# Patient Record
Sex: Female | Born: 1970 | Race: White | Hispanic: No | Marital: Married | Smoking: Former smoker
Health system: Southern US, Community
[De-identification: ages and names within clinical notes are randomized; demographics above are authoritative.]

## PROBLEM LIST (undated history)

## (undated) DIAGNOSIS — N920 Excessive and frequent menstruation with regular cycle: Secondary | ICD-10-CM

## (undated) DIAGNOSIS — K8020 Calculus of gallbladder without cholecystitis without obstruction: Secondary | ICD-10-CM

## (undated) DIAGNOSIS — D259 Leiomyoma of uterus, unspecified: Secondary | ICD-10-CM

## (undated) DIAGNOSIS — Z973 Presence of spectacles and contact lenses: Secondary | ICD-10-CM

## (undated) HISTORY — PX: WISDOM TOOTH EXTRACTION: SHX21

## (undated) HISTORY — DX: Calculus of gallbladder without cholecystitis without obstruction: K80.20

---

## 2011-07-15 ENCOUNTER — Ambulatory Visit (INDEPENDENT_AMBULATORY_CARE_PROVIDER_SITE_OTHER): Payer: BC Managed Care – PPO

## 2011-07-15 DIAGNOSIS — R05 Cough: Secondary | ICD-10-CM

## 2011-07-15 DIAGNOSIS — R059 Cough, unspecified: Secondary | ICD-10-CM

## 2011-07-15 DIAGNOSIS — IMO0001 Reserved for inherently not codable concepts without codable children: Secondary | ICD-10-CM

## 2014-06-14 ENCOUNTER — Other Ambulatory Visit: Payer: Self-pay | Admitting: Obstetrics and Gynecology

## 2014-06-14 DIAGNOSIS — N63 Unspecified lump in unspecified breast: Secondary | ICD-10-CM

## 2014-06-24 ENCOUNTER — Other Ambulatory Visit: Payer: Self-pay | Admitting: Obstetrics and Gynecology

## 2014-06-24 ENCOUNTER — Ambulatory Visit
Admission: RE | Admit: 2014-06-24 | Discharge: 2014-06-24 | Disposition: A | Discharge status: Home / Self Care | Payer: BC Managed Care – PPO | Source: Ambulatory Visit | Attending: Obstetrics and Gynecology | Admitting: Obstetrics and Gynecology

## 2014-06-24 DIAGNOSIS — N6002 Solitary cyst of left breast: Secondary | ICD-10-CM

## 2014-06-24 DIAGNOSIS — N63 Unspecified lump in unspecified breast: Secondary | ICD-10-CM

## 2014-06-25 ENCOUNTER — Other Ambulatory Visit: Payer: BC Managed Care – PPO

## 2016-02-04 DIAGNOSIS — N909 Noninflammatory disorder of vulva and perineum, unspecified: Secondary | ICD-10-CM | POA: Diagnosis not present

## 2016-04-07 DIAGNOSIS — Z1231 Encounter for screening mammogram for malignant neoplasm of breast: Secondary | ICD-10-CM | POA: Diagnosis not present

## 2016-04-07 DIAGNOSIS — Z01419 Encounter for gynecological examination (general) (routine) without abnormal findings: Secondary | ICD-10-CM | POA: Diagnosis not present

## 2016-04-07 DIAGNOSIS — Z6826 Body mass index (BMI) 26.0-26.9, adult: Secondary | ICD-10-CM | POA: Diagnosis not present

## 2016-04-07 DIAGNOSIS — Z124 Encounter for screening for malignant neoplasm of cervix: Secondary | ICD-10-CM | POA: Diagnosis not present

## 2016-12-15 DIAGNOSIS — Z0000 Encounter for general adult medical examination without abnormal findings: Secondary | ICD-10-CM | POA: Diagnosis not present

## 2016-12-15 DIAGNOSIS — E7800 Pure hypercholesterolemia, unspecified: Secondary | ICD-10-CM | POA: Diagnosis not present

## 2017-04-14 DIAGNOSIS — D3131 Benign neoplasm of right choroid: Secondary | ICD-10-CM | POA: Diagnosis not present

## 2017-04-29 DIAGNOSIS — Z124 Encounter for screening for malignant neoplasm of cervix: Secondary | ICD-10-CM | POA: Diagnosis not present

## 2017-04-29 DIAGNOSIS — Z1231 Encounter for screening mammogram for malignant neoplasm of breast: Secondary | ICD-10-CM | POA: Diagnosis not present

## 2017-04-29 DIAGNOSIS — Z01419 Encounter for gynecological examination (general) (routine) without abnormal findings: Secondary | ICD-10-CM | POA: Diagnosis not present

## 2017-04-29 DIAGNOSIS — Z6828 Body mass index (BMI) 28.0-28.9, adult: Secondary | ICD-10-CM | POA: Diagnosis not present

## 2018-01-20 DIAGNOSIS — E7800 Pure hypercholesterolemia, unspecified: Secondary | ICD-10-CM | POA: Diagnosis not present

## 2018-01-20 DIAGNOSIS — Z0000 Encounter for general adult medical examination without abnormal findings: Secondary | ICD-10-CM | POA: Diagnosis not present

## 2018-05-09 DIAGNOSIS — D3131 Benign neoplasm of right choroid: Secondary | ICD-10-CM | POA: Diagnosis not present

## 2018-06-06 DIAGNOSIS — Z1231 Encounter for screening mammogram for malignant neoplasm of breast: Secondary | ICD-10-CM | POA: Diagnosis not present

## 2018-06-06 DIAGNOSIS — Z6830 Body mass index (BMI) 30.0-30.9, adult: Secondary | ICD-10-CM | POA: Diagnosis not present

## 2018-06-06 DIAGNOSIS — Z124 Encounter for screening for malignant neoplasm of cervix: Secondary | ICD-10-CM | POA: Diagnosis not present

## 2018-06-06 DIAGNOSIS — Z01419 Encounter for gynecological examination (general) (routine) without abnormal findings: Secondary | ICD-10-CM | POA: Diagnosis not present

## 2018-07-19 DIAGNOSIS — Z6830 Body mass index (BMI) 30.0-30.9, adult: Secondary | ICD-10-CM | POA: Diagnosis not present

## 2018-07-19 DIAGNOSIS — Z30433 Encounter for removal and reinsertion of intrauterine contraceptive device: Secondary | ICD-10-CM | POA: Diagnosis not present

## 2019-02-01 DIAGNOSIS — Z20828 Contact with and (suspected) exposure to other viral communicable diseases: Secondary | ICD-10-CM | POA: Diagnosis not present

## 2019-02-01 DIAGNOSIS — R509 Fever, unspecified: Secondary | ICD-10-CM | POA: Diagnosis not present

## 2019-02-20 DIAGNOSIS — E7800 Pure hypercholesterolemia, unspecified: Secondary | ICD-10-CM | POA: Diagnosis not present

## 2019-02-20 DIAGNOSIS — Z0000 Encounter for general adult medical examination without abnormal findings: Secondary | ICD-10-CM | POA: Diagnosis not present

## 2019-04-30 DIAGNOSIS — Z20828 Contact with and (suspected) exposure to other viral communicable diseases: Secondary | ICD-10-CM | POA: Diagnosis not present

## 2019-07-03 DIAGNOSIS — U071 COVID-19: Secondary | ICD-10-CM

## 2019-07-03 HISTORY — DX: COVID-19: U07.1

## 2019-07-11 DIAGNOSIS — Z20828 Contact with and (suspected) exposure to other viral communicable diseases: Secondary | ICD-10-CM | POA: Diagnosis not present

## 2019-07-18 DIAGNOSIS — R05 Cough: Secondary | ICD-10-CM | POA: Diagnosis not present

## 2019-07-18 DIAGNOSIS — G44209 Tension-type headache, unspecified, not intractable: Secondary | ICD-10-CM | POA: Diagnosis not present

## 2019-07-18 DIAGNOSIS — R5383 Other fatigue: Secondary | ICD-10-CM | POA: Diagnosis not present

## 2019-07-19 DIAGNOSIS — R05 Cough: Secondary | ICD-10-CM | POA: Diagnosis not present

## 2019-07-19 DIAGNOSIS — R5383 Other fatigue: Secondary | ICD-10-CM | POA: Diagnosis not present

## 2019-07-19 DIAGNOSIS — G44209 Tension-type headache, unspecified, not intractable: Secondary | ICD-10-CM | POA: Diagnosis not present

## 2019-10-03 DIAGNOSIS — Z8742 Personal history of other diseases of the female genital tract: Secondary | ICD-10-CM | POA: Diagnosis not present

## 2019-10-03 DIAGNOSIS — Z6830 Body mass index (BMI) 30.0-30.9, adult: Secondary | ICD-10-CM | POA: Diagnosis not present

## 2019-10-03 DIAGNOSIS — Z01419 Encounter for gynecological examination (general) (routine) without abnormal findings: Secondary | ICD-10-CM | POA: Diagnosis not present

## 2019-10-12 DIAGNOSIS — Z1231 Encounter for screening mammogram for malignant neoplasm of breast: Secondary | ICD-10-CM | POA: Diagnosis not present

## 2020-01-01 DIAGNOSIS — D3131 Benign neoplasm of right choroid: Secondary | ICD-10-CM | POA: Diagnosis not present

## 2020-04-15 DIAGNOSIS — Z0000 Encounter for general adult medical examination without abnormal findings: Secondary | ICD-10-CM | POA: Diagnosis not present

## 2020-04-15 DIAGNOSIS — E7800 Pure hypercholesterolemia, unspecified: Secondary | ICD-10-CM | POA: Diagnosis not present

## 2020-05-21 DIAGNOSIS — R0681 Apnea, not elsewhere classified: Secondary | ICD-10-CM | POA: Diagnosis not present

## 2020-09-24 DIAGNOSIS — R059 Cough, unspecified: Secondary | ICD-10-CM | POA: Diagnosis not present

## 2020-10-28 DIAGNOSIS — Z01419 Encounter for gynecological examination (general) (routine) without abnormal findings: Secondary | ICD-10-CM | POA: Diagnosis not present

## 2020-10-28 DIAGNOSIS — Z1231 Encounter for screening mammogram for malignant neoplasm of breast: Secondary | ICD-10-CM | POA: Diagnosis not present

## 2021-01-26 DIAGNOSIS — D3131 Benign neoplasm of right choroid: Secondary | ICD-10-CM | POA: Diagnosis not present

## 2021-04-21 DIAGNOSIS — L814 Other melanin hyperpigmentation: Secondary | ICD-10-CM | POA: Diagnosis not present

## 2021-04-21 DIAGNOSIS — L821 Other seborrheic keratosis: Secondary | ICD-10-CM | POA: Diagnosis not present

## 2021-04-21 DIAGNOSIS — D485 Neoplasm of uncertain behavior of skin: Secondary | ICD-10-CM | POA: Diagnosis not present

## 2021-05-25 ENCOUNTER — Emergency Department (HOSPITAL_COMMUNITY): Payer: BC Managed Care – PPO

## 2021-05-25 ENCOUNTER — Other Ambulatory Visit: Payer: Self-pay

## 2021-05-25 ENCOUNTER — Emergency Department (HOSPITAL_COMMUNITY)
Admission: EM | Admit: 2021-05-25 | Discharge: 2021-05-25 | Disposition: A | Discharge status: Home / Self Care | Payer: BC Managed Care – PPO | Attending: Emergency Medicine | Admitting: Emergency Medicine

## 2021-05-25 ENCOUNTER — Encounter (HOSPITAL_COMMUNITY): Payer: Self-pay

## 2021-05-25 DIAGNOSIS — R52 Pain, unspecified: Secondary | ICD-10-CM

## 2021-05-25 DIAGNOSIS — K8020 Calculus of gallbladder without cholecystitis without obstruction: Secondary | ICD-10-CM | POA: Diagnosis not present

## 2021-05-25 DIAGNOSIS — M5137 Other intervertebral disc degeneration, lumbosacral region: Secondary | ICD-10-CM | POA: Diagnosis not present

## 2021-05-25 DIAGNOSIS — R9431 Abnormal electrocardiogram [ECG] [EKG]: Secondary | ICD-10-CM | POA: Diagnosis not present

## 2021-05-25 DIAGNOSIS — I700 Atherosclerosis of aorta: Secondary | ICD-10-CM | POA: Diagnosis not present

## 2021-05-25 DIAGNOSIS — R1011 Right upper quadrant pain: Secondary | ICD-10-CM | POA: Diagnosis not present

## 2021-05-25 LAB — CBC WITH DIFFERENTIAL/PLATELET
Abs Immature Granulocytes: 0.04 10*3/uL (ref 0.00–0.07)
Basophils Absolute: 0 10*3/uL (ref 0.0–0.1)
Basophils Relative: 0 %
Eosinophils Absolute: 0 10*3/uL (ref 0.0–0.5)
Eosinophils Relative: 0 %
HCT: 44.6 % (ref 36.0–46.0)
Hemoglobin: 15.6 g/dL — ABNORMAL HIGH (ref 12.0–15.0)
Immature Granulocytes: 0 %
Lymphocytes Relative: 6 %
Lymphs Abs: 0.6 10*3/uL — ABNORMAL LOW (ref 0.7–4.0)
MCH: 31.8 pg (ref 26.0–34.0)
MCHC: 35 g/dL (ref 30.0–36.0)
MCV: 91 fL (ref 80.0–100.0)
Monocytes Absolute: 0.3 10*3/uL (ref 0.1–1.0)
Monocytes Relative: 3 %
Neutro Abs: 9.7 10*3/uL — ABNORMAL HIGH (ref 1.7–7.7)
Neutrophils Relative %: 91 %
Platelets: 286 10*3/uL (ref 150–400)
RBC: 4.9 MIL/uL (ref 3.87–5.11)
RDW: 12.2 % (ref 11.5–15.5)
WBC: 10.7 10*3/uL — ABNORMAL HIGH (ref 4.0–10.5)
nRBC: 0 % (ref 0.0–0.2)

## 2021-05-25 LAB — COMPREHENSIVE METABOLIC PANEL
ALT: 30 U/L (ref 0–44)
AST: 24 U/L (ref 15–41)
Albumin: 4.9 g/dL (ref 3.5–5.0)
Alkaline Phosphatase: 71 U/L (ref 38–126)
Anion gap: 8 (ref 5–15)
BUN: 9 mg/dL (ref 6–20)
CO2: 25 mmol/L (ref 22–32)
Calcium: 9.3 mg/dL (ref 8.9–10.3)
Chloride: 102 mmol/L (ref 98–111)
Creatinine, Ser: 0.76 mg/dL (ref 0.44–1.00)
GFR, Estimated: >60 mL/min (ref >60)
Glucose, Bld: 182 mg/dL — ABNORMAL HIGH (ref 70–99)
Potassium: 4.2 mmol/L (ref 3.5–5.1)
Sodium: 135 mmol/L (ref 135–145)
Total Bilirubin: 0.7 mg/dL (ref 0.3–1.2)
Total Protein: 7.8 g/dL (ref 6.5–8.1)

## 2021-05-25 LAB — HCG, QUANTITATIVE, PREGNANCY: hCG, Beta Chain, Quant, S: 1 m[IU]/mL (ref <5)

## 2021-05-25 LAB — LIPASE, BLOOD: Lipase: 28 U/L (ref 11–51)

## 2021-05-25 MED ORDER — OXYCODONE HCL 5 MG PO TABS: 5.0000 mg | ORAL_TABLET | ORAL | 0 refills | Status: DC | PRN

## 2021-05-25 MED ORDER — SODIUM CHLORIDE 0.9 % IV BOLUS
500.0000 mL | Freq: Once | INTRAVENOUS | Status: AC
  Administered 2021-05-25: 500 mL via INTRAVENOUS

## 2021-05-25 MED ORDER — IOHEXOL 350 MG/ML SOLN
80.0000 mL | Freq: Once | INTRAVENOUS | Status: AC | PRN
  Administered 2021-05-25: 80 mL via INTRAVENOUS

## 2021-05-25 MED ORDER — SODIUM CHLORIDE 0.9 % IV BOLUS
1000.0000 mL | Freq: Once | INTRAVENOUS | Status: AC
  Administered 2021-05-25: 1000 mL via INTRAVENOUS

## 2021-05-25 MED ORDER — ONDANSETRON HCL 4 MG/2ML IJ SOLN
4.0000 mg | Freq: Once | INTRAMUSCULAR | Status: AC
  Administered 2021-05-25: 4 mg via INTRAVENOUS
  Filled 2021-05-25: qty 2

## 2021-05-25 MED ORDER — FENTANYL CITRATE PF 50 MCG/ML IJ SOSY
50.0000 ug | PREFILLED_SYRINGE | Freq: Once | INTRAMUSCULAR | Status: AC
Start: 2021-05-25 — End: 2021-05-25
  Administered 2021-05-25: 50 ug via INTRAVENOUS
  Filled 2021-05-25: qty 1

## 2021-05-25 MED ORDER — DIPHENHYDRAMINE HCL 50 MG/ML IJ SOLN
25.0000 mg | Freq: Once | INTRAMUSCULAR | Status: AC
  Administered 2021-05-25: 25 mg via INTRAVENOUS
  Filled 2021-05-25: qty 1

## 2021-05-25 MED ORDER — PROCHLORPERAZINE EDISYLATE 10 MG/2ML IJ SOLN
10.0000 mg | Freq: Once | INTRAMUSCULAR | Status: AC
  Administered 2021-05-25: 10 mg via INTRAVENOUS
  Filled 2021-05-25: qty 2

## 2021-05-25 MED ORDER — ONDANSETRON HCL 4 MG PO TABS: 4.0000 mg | ORAL_TABLET | Freq: Four times a day (QID) | ORAL | 0 refills | Status: AC

## 2021-05-25 NOTE — Discharge Instructions (Signed)
Suspect that your pain is secondary from gallstones.  There is no evidence of gallbladder infection at this time.  Make dietary modifications as we discussed.  Please follow-up with general surgeon as your gallbladder may need to be removed.  If you develop any fever, worsening pain, nausea or vomiting please return for reevaluation.

## 2021-05-25 NOTE — ED Provider Notes (Signed)
[facility] DEPT Provider Note   CSN: [id number] Arrival date & time: 05/25/21  0539     History Chief Complaint  Patient presents with   Vomiting    Amber Davies is a 50 y.o. female.  The history is provided by the patient.  Emesis Severity:  Moderate Timing:  Constant Progression:  Worsening Chronicity:  New Relieved by:  Nothing Worsened by:  Nothing Associated symptoms: abdominal pain   Associated symptoms: no arthralgias, no chills, no cough, no diarrhea, no fever, no headaches, no myalgias, no sore throat and no URI   Risk factors: no prior abdominal surgery, no sick contacts and no suspect food intake       History reviewed. No pertinent past medical history.  There are no problems to display for this patient.   History reviewed. No pertinent surgical history.   OB History   No obstetric history on file.     No family history on file.  Social History   Tobacco Use   Smoking status: Never   Smokeless tobacco: Never  Substance Use Topics   Alcohol use: Never   Drug use: Never    Home Medications Prior to Admission medications   Medication Sig Start Date End Date Taking? Authorizing Provider  ondansetron (ZOFRAN) 4 MG tablet Take 1 tablet (4 mg total) by mouth every 6 (six) hours for 20 doses. 05/25/21 05/30/21 Yes Balin Vandegrift, DO  oxyCODONE (ROXICODONE) 5 MG immediate release tablet Take 1 tablet (5 mg total) by mouth every 4 (four) hours as needed for up to 15 doses for breakthrough pain. 05/25/21  Yes Natallia Stellmach, DO    Allergies    Patient has no known allergies.  Review of Systems   Review of Systems  Constitutional:  Negative for chills and fever.  HENT:  Negative for ear pain and sore throat.   Eyes:  Negative for pain and visual disturbance.  Respiratory:  Negative for cough and shortness of breath.   Cardiovascular:  Negative for chest pain and palpitations.  Gastrointestinal:  Positive for  abdominal pain and vomiting. Negative for diarrhea.  Genitourinary:  Negative for dysuria and hematuria.  Musculoskeletal:  Negative for arthralgias, back pain and myalgias.  Skin:  Negative for color change and rash.  Neurological:  Negative for seizures, syncope and headaches.  All other systems reviewed and are negative.  Physical Exam Updated Vital Signs BP (!) 172/87   Pulse 84   Temp 97.6 F (36.4 C) (Oral)   Resp 16   Ht 5\' 6"  (1.676 m)   Wt 81.6 kg   SpO2 99%   BMI 29.05 kg/m   Physical Exam Vitals and nursing note reviewed.  Constitutional:      General: She is not in acute distress.    Appearance: She is well-developed. She is ill-appearing.  HENT:     Head: Normocephalic and atraumatic.     Nose: Nose normal.     Mouth/Throat:     Mouth: Mucous membranes are dry.  Eyes:     Extraocular Movements: Extraocular movements intact.     Conjunctiva/sclera: Conjunctivae normal.     Pupils: Pupils are equal, round, and reactive to light.  Cardiovascular:     Rate and Rhythm: Normal rate and regular rhythm.     Pulses: Normal pulses.     Heart sounds: Normal heart sounds. No murmur heard. Pulmonary:     Effort: Pulmonary effort is normal. No respiratory distress.     Breath sounds:  Normal breath sounds.  Abdominal:     Palpations: Abdomen is soft.     Tenderness: There is abdominal tenderness (upper admoen). There is no right CVA tenderness or left CVA tenderness.  Musculoskeletal:     Cervical back: Normal range of motion and neck supple.  Skin:    General: Skin is warm and dry.     Capillary Refill: Capillary refill takes less than 2 seconds.  Neurological:     General: No focal deficit present.     Mental Status: She is alert.    ED Results / Procedures / Treatments   Labs (all labs ordered are listed, but only abnormal results are displayed) Labs Reviewed  CBC WITH DIFFERENTIAL/PLATELET - Abnormal; Notable for the following components:      Result Value    WBC 10.7 (*)    Hemoglobin 15.6 (*)    Neutro Abs 9.7 (*)    Lymphs Abs 0.6 (*)    All other components within normal limits  COMPREHENSIVE METABOLIC PANEL - Abnormal; Notable for the following components:   Glucose, Bld 182 (*)    All other components within normal limits  LIPASE, BLOOD  HCG, QUANTITATIVE, PREGNANCY  URINALYSIS, ROUTINE W REFLEX MICROSCOPIC    EKG EKG Interpretation  Date/Time:  Monday May 25 2021 07:41:07 EDT Ventricular Rate:  75 PR Interval:  125 QRS Duration: 97 QT Interval:  399 QTC Calculation: 446 R Axis:   67 Text Interpretation: Sinus rhythm Confirmed by Lennice Sites ([id number]) on 05/25/2021 8:02:20 AM  Radiology CT ABDOMEN PELVIS W CONTRAST  Result Date: 05/25/2021 CLINICAL DATA:  Evaluate pancreatitis. Persistent right upper quadrant and epigastric pain. EXAM: CT ABDOMEN AND PELVIS WITH CONTRAST TECHNIQUE: Multidetector CT imaging of the abdomen and pelvis was performed using the standard protocol following bolus administration of intravenous contrast. CONTRAST:  25mL OMNIPAQUE IOHEXOL 350 MG/ML SOLN COMPARISON:  None. FINDINGS: Lower chest: No acute abnormality. Hepatobiliary: No focal liver abnormality. Two stones are identified within the gallbladder measuring up to 1.7 cm, image 31/2. No gallbladder wall thickening or pericholecystic inflammation. No signs of bile duct dilatation. Pancreas: Unremarkable. No pancreatic ductal dilatation or surrounding inflammatory changes. Spleen: Normal in size without focal abnormality. Adrenals/Urinary Tract: Normal adrenal glands. No kidney mass or hydronephrosis identified bilaterally. Urinary bladder is unremarkable. Stomach/Bowel: Stomach is within normal limits. Appendix appears normal. No evidence of bowel wall thickening, distention, or inflammatory changes. Scattered colonic diverticula without signs of acute diverticulitis. Vascular/Lymphatic: Aortic atherosclerosis. No enlarged abdominal or pelvic lymph  nodes. Reproductive: There is an IUD within the endometrial cavity. Uterus appears normal. No adnexal mass. Other: No free fluid or fluid collections. Musculoskeletal: No acute or significant osseous findings. Degenerative disc disease identified at L5-S1. IMPRESSION: 1. No acute findings within the abdomen or pelvis. No findings to suggest acute pancreatitis. 2. Gallstones. If there is a clinical concern for acute cholecystitis consider further evaluation with gallbladder sonogram or nuclear medicine hepatic biliary scan. 3. Aortic Atherosclerosis (ICD10-I70.0). Electronically Signed   By: Kerby Moors M.D.   On: 05/25/2021 10:11   US Abdomen Limited RUQ (LIVER/GB)  Result Date: 05/25/2021 CLINICAL DATA:  Right upper quadrant pain EXAM: ULTRASOUND ABDOMEN LIMITED RIGHT UPPER QUADRANT COMPARISON:  CT abdomen pelvis 05/25/2021 FINDINGS: Gallbladder: Cholelithiasis. No gallbladder wall thickening or pericholecystic fluid. Sonographic Percell Miller sign is negative per technologist. Common bile duct: Diameter: 4 mm Liver: No focal lesion identified. Within normal limits in parenchymal echogenicity. Portal vein is patent on color Doppler imaging with normal direction  of blood flow towards the liver. Other: None. IMPRESSION: Moderate cholelithiasis without additional sonographic evidence of acute cholecystitis. Electronically Signed   By: Miachel Roux M.D.   On: 05/25/2021 11:50    Procedures Procedures   Medications Ordered in ED Medications  sodium chloride 0.9 % bolus 1,000 mL (1,000 mLs Intravenous Bolus 05/25/21 0729)  ondansetron (ZOFRAN) injection 4 mg (4 mg Intravenous Given 05/25/21 0729)  fentaNYL (SUBLIMAZE) injection 50 mcg (50 mcg Intravenous Given 05/25/21 0742)  prochlorperazine (COMPAZINE) injection 10 mg (10 mg Intravenous Given 05/25/21 0914)  diphenhydrAMINE (BENADRYL) injection 25 mg (25 mg Intravenous Given 05/25/21 0913)  sodium chloride 0.9 % bolus 500 mL (500 mLs Intravenous Bolus  05/25/21 0914)  iohexol (OMNIPAQUE) 350 MG/ML injection 80 mL (80 mLs Intravenous Contrast Given 05/25/21 0933)    ED Course  I have reviewed the triage vital signs and the nursing notes.  Pertinent labs & imaging results that were available during my care of the patient were reviewed by me and considered in my medical decision making (see chart for details).    MDM Rules/Calculators/A&P                           Gyselle Matthew is here with abdominal pain, nausea, vomiting.  Overall unremarkable vitals.  No fever.  Woke up feeling nauseous with upper abdominal pain.  Denies any suspicious food intake.  No fevers.  Patient with tenderness to her upper abdomen.  Still feels nauseous on exam of her.  No chest pain or shortness of breath.  Suspect viral/foodborne process versus may be pancreatitis or cholecystitis.  Less likely to be bowel obstruction.  We will get basic labs and CT scan abdomen and pelvis to further evaluate.  Will give IV fluids, IV antiemetics and pain medicine and reevaluate.  No significant anemia, electrolyte abnormality, kidney injury, leukocytosis.  Gallbladder and liver enzymes within normal limits.  Lipase within normal limits.  However CT scan showed evidence of gallstones.  But no obvious cholecystitis.  Ultrasound was obtained that showed moderate cholelithiasis but no evidence of acute cholecystitis.  Her pain is much improved.  Pain started after eating pizza last night.  Suspect her pain is from gallstones.  Educated her about dietary modifications.  We will write her for nausea medicine and pain medicine to use as needed.  She understands return precautions but at this time I think she is safe for follow-up with general surgery.  Discharged in good condition.  This chart was dictated using voice recognition software.  Despite best efforts to proofread,  errors can occur which can change the documentation meaning.   Final Clinical Impression(s) / ED Diagnoses Final  diagnoses:  Pain  RUQ abdominal pain  Calculus of gallbladder without cholecystitis without obstruction    Rx / DC Orders ED Discharge Orders          Ordered    oxyCODONE (ROXICODONE) 5 MG immediate release tablet  Every 4 hours PRN        05/25/21 1230    ondansetron (ZOFRAN) 4 MG tablet  Every 6 hours        05/25/21 [place], [place], DO 05/25/21 1232

## 2021-05-25 NOTE — ED Triage Notes (Signed)
Pt reports feeling ill last night around 2100. Went to bed and began vomiting around 2130. Approx 7-8 episodes. Afterwards had an epigastric burning sensation. Denies diarrhea.

## 2021-05-25 NOTE — ED Notes (Signed)
Pt states that she is unable to use the bathroom at this time, will inform me when she needs too.

## 2021-06-10 DIAGNOSIS — K8020 Calculus of gallbladder without cholecystitis without obstruction: Secondary | ICD-10-CM | POA: Diagnosis not present

## 2021-06-19 ENCOUNTER — Encounter (HOSPITAL_COMMUNITY): Payer: Self-pay | Admitting: General Surgery

## 2021-06-19 ENCOUNTER — Other Ambulatory Visit: Payer: Self-pay

## 2021-06-19 NOTE — Progress Notes (Signed)
Spoke with pt for pre-op call. Pt denies cardiac history, HTN or Diabetes.  Pt's surgery is scheduled as ambulatory so no Covid test is required prior to surgery.

## 2021-06-22 ENCOUNTER — Ambulatory Visit: Payer: Self-pay | Admitting: Neurosurgery

## 2021-06-22 ENCOUNTER — Encounter (HOSPITAL_COMMUNITY): Payer: Self-pay | Admitting: General Surgery

## 2021-06-22 NOTE — Anesthesia Preprocedure Evaluation (Addendum)
Anesthesia Evaluation  Patient identified by MRN, date of birth, ID band Patient awake    Reviewed: Allergy & Precautions, NPO status , Patient's Chart, lab work & pertinent test results  Airway Mallampati: II  TM Distance: >3 FB Neck ROM: Full    Dental no notable dental hx. (+) Teeth Intact, Dental Advisory Given   Pulmonary neg pulmonary ROS, former smoker,    Pulmonary exam normal breath sounds clear to auscultation       Cardiovascular negative cardio ROS Normal cardiovascular exam Rhythm:Regular Rate:Normal     Neuro/Psych negative neurological ROS  negative psych ROS   GI/Hepatic Neg liver ROS, Symptomatic cholelithiasis   Endo/Other  hypercholesterolemia  Renal/GU negative Renal ROS  negative genitourinary   Musculoskeletal negative musculoskeletal ROS (+)   Abdominal   Peds  Hematology negative hematology ROS (+)   Anesthesia Other Findings   Reproductive/Obstetrics negative OB ROS                            Anesthesia Physical Anesthesia Plan  ASA: 2  Anesthesia Plan: General   Post-op Pain Management: Dilaudid IV, Gabapentin PO (pre-op), Celebrex PO (pre-op) and Tylenol PO (pre-op)   Induction: Intravenous  PONV Risk Score and Plan: 4 or greater and Treatment may vary due to age or medical condition, Midazolam, Scopolamine patch - Pre-op, Ondansetron, Dexamethasone and Diphenhydramine  Airway Management Planned: Oral ETT  Additional Equipment:   Intra-op Plan:   Post-operative Plan: Extubation in OR  Informed Consent: I have reviewed the patients History and Physical, chart, labs and discussed the procedure including the risks, benefits and alternatives for the proposed anesthesia with the patient or authorized representative who has indicated his/her understanding and acceptance.     Dental advisory given  Plan Discussed with: CRNA and  Anesthesiologist  Anesthesia Plan Comments:       Anesthesia Quick Evaluation

## 2021-06-22 NOTE — H&P (View-Only) (Signed)
DOB: [birth year] DATE OF ENCOUNTER: 06/10/2021  Subjective   Chief Complaint: Gallstones   History of Present Illness: Amber Davies is a 50 y.o. female who is seen today as an office consultation at the request of Dr. Ronnald Nian for evaluation of abdominal pain.   She had presented to the emergency room in October 24 complaining of moderate and constant abdominal pain in the right upper abdomen underneath her rib cage. She tried Tums without relief.. She had nausea and vomiting. She was mainly having discomfort in her upper abdomen. She underwent labs and imaging. CT scan showed cholelithiasis. This was followed by an abdominal ultrasound that showed moderate cholelithiasis without any evidence of acute cholecystitis. Common bile duct was 4 mm. CBC revealed a white count of 10.7 and a hemoglobin of 15.6. Comprehensive metabolic panel was within normal limits except for an elevated blood sugar level of 182. She was not fasting.  Since then she has not had an attack. She states in retrospect she probably has had several milder episodes over the years and just attributed to indigestion. She definitely remembers 1 instance after her rehearsal dinner before her wedding that she had a fair amount of upper abdominal pain and thought she had just overindulged in it with the stress of the wedding  Review of Systems: A complete review of systems was obtained from the patient. I have reviewed this information and discussed as appropriate with the patient. See HPI as well for other ROS.  ROS   Medical History: Past Medical History:  Diagnosis Date   Allergies  Seasonal   Patient Active Problem List  Diagnosis   Breast lump   Difficulty sleeping   Hypercholesterolemia   Tension-type headache   Past Surgical History:  Procedure Laterality Date   Wisdom Teeth 1993    No Known Allergies  Current Outpatient Medications on File Prior to Visit  Medication Sig Dispense Refill   loratadine  (CLARITIN ORAL) Claritin   No current facility-administered medications on file prior to visit.   History reviewed. No pertinent family history.   Social History   Tobacco Use  Smoking Status Former   Types: Cigarettes   Quit date: 2005   Years since quitting: 17.8  Smokeless Tobacco Never    Social History   Socioeconomic History   Marital status: Married  Tobacco Use   Smoking status: Former  Types: Cigarettes  Quit date: 2005  Years since quitting: 17.8   Smokeless tobacco: Never  Vaping Use   Vaping Use: Never used  Substance and Sexual Activity   Alcohol use: Yes   Drug use: Never   Objective:   Vitals:  06/10/21 0909  BP: 128/82  Pulse: 77  Temp: 36.8 C (98.2 F)  SpO2: 98%  Weight: 84.1 kg (185 lb 6.4 oz)  Height: 167.6 cm (5\' 6" )   Body mass index is 29.92 kg/m.  Constitutional: NAD; conversant; no deformities Eyes: Moist conjunctiva; no lid lag; anicteric; PERRL Neck: Trachea midline; no thyromegaly Lungs: Normal respiratory effort; no tactile fremitus CV: RRR; no palpable thrills; no pitting edema GI: Abd soft, nontender nondistended; no palpable hepatosplenomegaly MSK: Normal gait; no clubbing/cyanosis Psychiatric: Appropriate affect; alert and oriented x3 Lymphatic: No palpable cervical or axillary lymphadenopathy Skin: No rash or lesions  Labs, Imaging and Diagnostic Testing: Documented above  Assessment and Plan:  Diagnoses and all orders for this visit:  Symptomatic cholelithiasis    I believe the patient's symptoms are consistent with gallbladder disease.  We discussed gallbladder disease.  The patient was given Neurosurgeon. We discussed non-operative and operative management. We discussed the signs & symptoms of acute cholecystitis  I discussed laparoscopic cholecystectomy with possible IOC in detail. The patient was given educational material as well as diagrams detailing the procedure. We discussed the risks and  benefits of a laparoscopic cholecystectomy including, but not limited to bleeding, infection, injury to surrounding structures such as the intestine or liver, bile leak, retained gallstones, need to convert to an open procedure, prolonged diarrhea, blood clots such as DVT, common bile duct injury, anesthesia risks, and possible need for additional procedures. We discussed the typical post-operative recovery course. I explained that the likelihood of improvement of their symptoms is good.  This patient encounter took 35 minutes today to perform the following: take history, perform exam, review outside records, interpret imaging, counsel the patient on their diagnosis and document encounter, findings & plan in the EHR  No follow-ups on file.  Amariyana Heacox Leanne Chang, MD  General, Minimally Invasive, & Bariatric Surgery

## 2021-06-22 NOTE — H&P (Signed)
DOB: 1971/07/29 DATE OF ENCOUNTER: 06/10/2021  Subjective   Chief Complaint: Gallstones   History of Present Illness: Amber Davies is a 50 y.o. female who is seen today as an office consultation at the request of Dr. Ronnald Nian for evaluation of abdominal pain.   She had presented to the emergency room in October 24 complaining of moderate and constant abdominal pain in the right upper abdomen underneath her rib cage. She tried Tums without relief.. She had nausea and vomiting. She was mainly having discomfort in her upper abdomen. She underwent labs and imaging. CT scan showed cholelithiasis. This was followed by an abdominal ultrasound that showed moderate cholelithiasis without any evidence of acute cholecystitis. Common bile duct was 4 mm. CBC revealed a white count of 10.7 and a hemoglobin of 15.6. Comprehensive metabolic panel was within normal limits except for an elevated blood sugar level of 182. She was not fasting.  Since then she has not had an attack. She states in retrospect she probably has had several milder episodes over the years and just attributed to indigestion. She definitely remembers 1 instance after her rehearsal dinner before her wedding that she had a fair amount of upper abdominal pain and thought she had just overindulged in it with the stress of the wedding  Review of Systems: A complete review of systems was obtained from the patient. I have reviewed this information and discussed as appropriate with the patient. See HPI as well for other ROS.  ROS   Medical History: Past Medical History:  Diagnosis Date   Allergies  Seasonal   Patient Active Problem List  Diagnosis   Breast lump   Difficulty sleeping   Hypercholesterolemia   Tension-type headache   Past Surgical History:  Procedure Laterality Date   Wisdom Teeth 1993    No Known Allergies  Current Outpatient Medications on File Prior to Visit  Medication Sig Dispense Refill   loratadine  (CLARITIN ORAL) Claritin   No current facility-administered medications on file prior to visit.   History reviewed. No pertinent family history.   Social History   Tobacco Use  Smoking Status Former   Types: Cigarettes   Quit date: 2005   Years since quitting: 17.8  Smokeless Tobacco Never    Social History   Socioeconomic History   Marital status: Married  Tobacco Use   Smoking status: Former  Types: Cigarettes  Quit date: 2005  Years since quitting: 17.8   Smokeless tobacco: Never  Vaping Use   Vaping Use: Never used  Substance and Sexual Activity   Alcohol use: Yes   Drug use: Never   Objective:   Vitals:  06/10/21 0909  BP: 128/82  Pulse: 77  Temp: 36.8 C (98.2 F)  SpO2: 98%  Weight: 84.1 kg (185 lb 6.4 oz)  Height: 167.6 cm (5\' 6" )   Body mass index is 29.92 kg/m.  Constitutional: NAD; conversant; no deformities Eyes: Moist conjunctiva; no lid lag; anicteric; PERRL Neck: Trachea midline; no thyromegaly Lungs: Normal respiratory effort; no tactile fremitus CV: RRR; no palpable thrills; no pitting edema GI: Abd soft, nontender nondistended; no palpable hepatosplenomegaly MSK: Normal gait; no clubbing/cyanosis Psychiatric: Appropriate affect; alert and oriented x3 Lymphatic: No palpable cervical or axillary lymphadenopathy Skin: No rash or lesions  Labs, Imaging and Diagnostic Testing: Documented above  Assessment and Plan:  Diagnoses and all orders for this visit:  Symptomatic cholelithiasis    I believe the patient's symptoms are consistent with gallbladder disease.  We discussed gallbladder disease.  The patient was given Neurosurgeon. We discussed non-operative and operative management. We discussed the signs & symptoms of acute cholecystitis  I discussed laparoscopic cholecystectomy with possible IOC in detail. The patient was given educational material as well as diagrams detailing the procedure. We discussed the risks and  benefits of a laparoscopic cholecystectomy including, but not limited to bleeding, infection, injury to surrounding structures such as the intestine or liver, bile leak, retained gallstones, need to convert to an open procedure, prolonged diarrhea, blood clots such as DVT, common bile duct injury, anesthesia risks, and possible need for additional procedures. We discussed the typical post-operative recovery course. I explained that the likelihood of improvement of their symptoms is good.  This patient encounter took 35 minutes today to perform the following: take history, perform exam, review outside records, interpret imaging, counsel the patient on their diagnosis and document encounter, findings & plan in the EHR  No follow-ups on file.  Ernesha Ramone Leanne Chang, MD  General, Minimally Invasive, & Bariatric Surgery

## 2021-06-23 ENCOUNTER — Ambulatory Visit (HOSPITAL_COMMUNITY)
Admission: RE | Admit: 2021-06-23 | Discharge: 2021-06-23 | Disposition: A | Discharge status: Home / Self Care | Payer: BC Managed Care – PPO | Source: Ambulatory Visit | Attending: General Surgery | Admitting: General Surgery

## 2021-06-23 ENCOUNTER — Encounter (HOSPITAL_COMMUNITY): Payer: Self-pay | Admitting: General Surgery

## 2021-06-23 ENCOUNTER — Ambulatory Visit (HOSPITAL_COMMUNITY): Payer: BC Managed Care – PPO

## 2021-06-23 ENCOUNTER — Ambulatory Visit (HOSPITAL_COMMUNITY): Payer: BC Managed Care – PPO | Admitting: Anesthesiology

## 2021-06-23 ENCOUNTER — Other Ambulatory Visit: Payer: Self-pay

## 2021-06-23 ENCOUNTER — Encounter (HOSPITAL_COMMUNITY)
Admission: RE | Disposition: A | Discharge status: Home / Self Care | Payer: Self-pay | Source: Ambulatory Visit | Attending: General Surgery

## 2021-06-23 DIAGNOSIS — Z87891 Personal history of nicotine dependence: Secondary | ICD-10-CM | POA: Diagnosis not present

## 2021-06-23 DIAGNOSIS — K8000 Calculus of gallbladder with acute cholecystitis without obstruction: Secondary | ICD-10-CM | POA: Diagnosis not present

## 2021-06-23 DIAGNOSIS — Z419 Encounter for procedure for purposes other than remedying health state, unspecified: Secondary | ICD-10-CM

## 2021-06-23 DIAGNOSIS — Z9049 Acquired absence of other specified parts of digestive tract: Secondary | ICD-10-CM | POA: Diagnosis not present

## 2021-06-23 DIAGNOSIS — E7800 Pure hypercholesterolemia, unspecified: Secondary | ICD-10-CM | POA: Diagnosis not present

## 2021-06-23 DIAGNOSIS — K8020 Calculus of gallbladder without cholecystitis without obstruction: Secondary | ICD-10-CM | POA: Diagnosis not present

## 2021-06-23 DIAGNOSIS — K8010 Calculus of gallbladder with chronic cholecystitis without obstruction: Secondary | ICD-10-CM | POA: Diagnosis not present

## 2021-06-23 HISTORY — PX: CHOLECYSTECTOMY: SHX55

## 2021-06-23 HISTORY — PX: INTRAOPERATIVE CHOLANGIOGRAM: SHX5230

## 2021-06-23 LAB — POCT PREGNANCY, URINE: Preg Test, Ur: NEGATIVE

## 2021-06-23 SURGERY — LAPAROSCOPIC CHOLECYSTECTOMY
Anesthesia: General | Site: Abdomen

## 2021-06-23 MED ORDER — DROPERIDOL 2.5 MG/ML IJ SOLN: 0.6250 mg | Freq: Once | INTRAMUSCULAR | Status: DC | PRN

## 2021-06-23 MED ORDER — OXYCODONE HCL 5 MG/5ML PO SOLN: 5.0000 mg | Freq: Once | ORAL | Status: DC | PRN

## 2021-06-23 MED ORDER — ROCURONIUM BROMIDE 10 MG/ML (PF) SYRINGE
PREFILLED_SYRINGE | INTRAVENOUS | Status: DC | PRN
  Administered 2021-06-23: 20 mg via INTRAVENOUS
  Administered 2021-06-23: 60 mg via INTRAVENOUS

## 2021-06-23 MED ORDER — ACETAMINOPHEN 500 MG PO TABS: 1000.0000 mg | ORAL_TABLET | ORAL | Status: AC

## 2021-06-23 MED ORDER — ACETAMINOPHEN 500 MG PO TABS: 1000.0000 mg | ORAL_TABLET | Freq: Three times a day (TID) | ORAL | 0 refills | Status: AC

## 2021-06-23 MED ORDER — MIDAZOLAM HCL 2 MG/2ML IJ SOLN
INTRAMUSCULAR | Status: AC
  Filled 2021-06-23: qty 2

## 2021-06-23 MED ORDER — SODIUM CHLORIDE 0.9 % IV SOLN
2.0000 g | INTRAVENOUS | Status: AC
  Administered 2021-06-23: 2 g via INTRAVENOUS

## 2021-06-23 MED ORDER — HYDROMORPHONE HCL 1 MG/ML IJ SOLN: 0.2500 mg | INTRAMUSCULAR | Status: DC | PRN

## 2021-06-23 MED ORDER — DIPHENHYDRAMINE HCL 50 MG/ML IJ SOLN
25.0000 mg | Freq: Once | INTRAMUSCULAR | Status: AC
  Administered 2021-06-23: 25 mg via INTRAVENOUS
  Filled 2021-06-23: qty 1

## 2021-06-23 MED ORDER — CHLORHEXIDINE GLUCONATE CLOTH 2 % EX PADS: 6.0000 | MEDICATED_PAD | Freq: Once | CUTANEOUS | Status: DC

## 2021-06-23 MED ORDER — SUGAMMADEX SODIUM 200 MG/2ML IV SOLN
INTRAVENOUS | Status: DC | PRN
  Administered 2021-06-23: 200 mg via INTRAVENOUS

## 2021-06-23 MED ORDER — OXYCODONE HCL 5 MG PO TABS: 5.0000 mg | ORAL_TABLET | Freq: Once | ORAL | Status: DC | PRN

## 2021-06-23 MED ORDER — HYDROMORPHONE HCL 1 MG/ML IJ SOLN
INTRAMUSCULAR | Status: AC
  Filled 2021-06-23: qty 0.5

## 2021-06-23 MED ORDER — BUPIVACAINE-EPINEPHRINE 0.25% -1:200000 IJ SOLN
INTRAMUSCULAR | Status: DC | PRN
  Administered 2021-06-23: 20 mL

## 2021-06-23 MED ORDER — SODIUM CHLORIDE (PF) 0.9 % IJ SOLN
INTRAMUSCULAR | Status: DC | PRN
  Administered 2021-06-23: 10 mL

## 2021-06-23 MED ORDER — HYDROMORPHONE HCL 1 MG/ML IJ SOLN
INTRAMUSCULAR | Status: AC
  Filled 2021-06-23: qty 1

## 2021-06-23 MED ORDER — LACTATED RINGERS IV SOLN: INTRAVENOUS | Status: DC

## 2021-06-23 MED ORDER — MIDAZOLAM HCL 2 MG/2ML IJ SOLN
INTRAMUSCULAR | Status: DC | PRN
  Administered 2021-06-23: 2 mg via INTRAVENOUS

## 2021-06-23 MED ORDER — HYDROMORPHONE HCL 1 MG/ML IJ SOLN
0.2500 mg | INTRAMUSCULAR | Status: DC | PRN
  Administered 2021-06-23: 0.5 mg via INTRAVENOUS

## 2021-06-23 MED ORDER — BUPIVACAINE-EPINEPHRINE (PF) 0.25% -1:200000 IJ SOLN
INTRAMUSCULAR | Status: AC
  Filled 2021-06-23: qty 30

## 2021-06-23 MED ORDER — ONDANSETRON HCL 4 MG/2ML IJ SOLN: 4.0000 mg | Freq: Once | INTRAMUSCULAR | Status: DC | PRN

## 2021-06-23 MED ORDER — 0.9 % SODIUM CHLORIDE (POUR BTL) OPTIME
TOPICAL | Status: DC | PRN
  Administered 2021-06-23: 1000 mL

## 2021-06-23 MED ORDER — INDOCYANINE GREEN 25 MG IV SOLR
7.5000 mg | Freq: Once | INTRAVENOUS | Status: DC
  Filled 2021-06-23: qty 10

## 2021-06-23 MED ORDER — LIDOCAINE 2% (20 MG/ML) 5 ML SYRINGE
INTRAMUSCULAR | Status: DC | PRN
  Administered 2021-06-23: 60 mg via INTRAVENOUS

## 2021-06-23 MED ORDER — CHLORHEXIDINE GLUCONATE 0.12 % MT SOLN
OROMUCOSAL | Status: AC
  Administered 2021-06-23: 15 mL via OROMUCOSAL
  Filled 2021-06-23: qty 15

## 2021-06-23 MED ORDER — PROPOFOL 10 MG/ML IV BOLUS
INTRAVENOUS | Status: DC | PRN
  Administered 2021-06-23: 160 mg via INTRAVENOUS

## 2021-06-23 MED ORDER — PROPOFOL 10 MG/ML IV BOLUS
INTRAVENOUS | Status: AC
  Filled 2021-06-23: qty 20

## 2021-06-23 MED ORDER — DEXAMETHASONE SODIUM PHOSPHATE 10 MG/ML IJ SOLN
INTRAMUSCULAR | Status: DC | PRN
Start: 2021-06-23 — End: 2021-06-23
  Administered 2021-06-23: 10 mg via INTRAVENOUS

## 2021-06-23 MED ORDER — OXYCODONE HCL 5 MG PO TABS: 5.0000 mg | ORAL_TABLET | Freq: Four times a day (QID) | ORAL | 0 refills | Status: DC | PRN

## 2021-06-23 MED ORDER — HEMOSTATIC AGENTS (NO CHARGE) OPTIME
TOPICAL | Status: DC | PRN
  Administered 2021-06-23: 1 via TOPICAL

## 2021-06-23 MED ORDER — ACETAMINOPHEN 500 MG PO TABS
ORAL_TABLET | ORAL | Status: AC
  Administered 2021-06-23: 1000 mg via ORAL
  Filled 2021-06-23: qty 2

## 2021-06-23 MED ORDER — SODIUM CHLORIDE 0.9 % IR SOLN
Status: DC | PRN
  Administered 2021-06-23: 1000 mL

## 2021-06-23 MED ORDER — CHLORHEXIDINE GLUCONATE 0.12 % MT SOLN: 15.0000 mL | Freq: Once | OROMUCOSAL | Status: AC

## 2021-06-23 MED ORDER — SCOPOLAMINE 1 MG/3DAYS TD PT72
1.0000 | MEDICATED_PATCH | TRANSDERMAL | Status: DC
  Administered 2021-06-23: 1 via TRANSDERMAL
  Filled 2021-06-23: qty 1

## 2021-06-23 MED ORDER — ORAL CARE MOUTH RINSE: 15.0000 mL | Freq: Once | OROMUCOSAL | Status: AC

## 2021-06-23 MED ORDER — SODIUM CHLORIDE 0.9 % IV SOLN
INTRAVENOUS | Status: AC
  Filled 2021-06-23: qty 2

## 2021-06-23 MED ORDER — ONDANSETRON HCL 4 MG/2ML IJ SOLN
INTRAMUSCULAR | Status: DC | PRN
  Administered 2021-06-23: 4 mg via INTRAVENOUS

## 2021-06-23 MED ORDER — FENTANYL CITRATE (PF) 250 MCG/5ML IJ SOLN
INTRAMUSCULAR | Status: AC
  Filled 2021-06-23: qty 5

## 2021-06-23 MED ORDER — FENTANYL CITRATE (PF) 250 MCG/5ML IJ SOLN
INTRAMUSCULAR | Status: DC | PRN
  Administered 2021-06-23 (×5): 50 ug via INTRAVENOUS

## 2021-06-23 MED ORDER — HYDROMORPHONE HCL 1 MG/ML IJ SOLN
INTRAMUSCULAR | Status: DC | PRN
  Administered 2021-06-23: .5 mg via INTRAVENOUS

## 2021-06-23 SURGICAL SUPPLY — 50 items
APPLIER CLIP 5 13 M/L LIGAMAX5 (MISCELLANEOUS) ×2
BAG COUNTER SPONGE SURGICOUNT (BAG) ×2 IMPLANT
BENZOIN TINCTURE PRP APPL 2/3 (GAUZE/BANDAGES/DRESSINGS) ×2 IMPLANT
BLADE CLIPPER SURG (BLADE) IMPLANT
BNDG ADH 1X3 SHEER STRL LF (GAUZE/BANDAGES/DRESSINGS) ×6 IMPLANT
CANISTER SUCT 3000ML PPV (MISCELLANEOUS) ×2 IMPLANT
CHLORAPREP W/TINT 26 (MISCELLANEOUS) ×2 IMPLANT
CLIP APPLIE 5 13 M/L LIGAMAX5 (MISCELLANEOUS) ×1 IMPLANT
COVER MAYO STAND STRL (DRAPES) ×2 IMPLANT
COVER SURGICAL LIGHT HANDLE (MISCELLANEOUS) ×2 IMPLANT
DERMABOND ADVANCED (GAUZE/BANDAGES/DRESSINGS) ×1
DERMABOND ADVANCED .7 DNX12 (GAUZE/BANDAGES/DRESSINGS) ×1 IMPLANT
DRAPE C-ARM 42X120 X-RAY (DRAPES) ×2 IMPLANT
ELECT REM PT RETURN 9FT ADLT (ELECTROSURGICAL) ×2
ELECTRODE REM PT RTRN 9FT ADLT (ELECTROSURGICAL) ×1 IMPLANT
GAUZE 4X4 16PLY ~~LOC~~+RFID DBL (SPONGE) ×2 IMPLANT
GAUZE SPONGE 2X2 8PLY STRL LF (GAUZE/BANDAGES/DRESSINGS) IMPLANT
GLOVE SURG ENC TEXT LTX SZ7.5 (GLOVE) ×2 IMPLANT
GLOVE SURG UNDER LTX SZ8 (GLOVE) ×4 IMPLANT
GOWN STRL REUS W/ TWL LRG LVL3 (GOWN DISPOSABLE) ×2 IMPLANT
GOWN STRL REUS W/TWL 2XL LVL3 (GOWN DISPOSABLE) ×2 IMPLANT
GOWN STRL REUS W/TWL LRG LVL3 (GOWN DISPOSABLE) ×2
GRASPER SUT TROCAR 14GX15 (MISCELLANEOUS) IMPLANT
HEMOSTAT SNOW SURGICEL 2X4 (HEMOSTASIS) ×2 IMPLANT
IRRIG SUCT STRYKERFLOW 2 WTIP (MISCELLANEOUS) ×2
IRRIGATION SUCT STRKRFLW 2 WTP (MISCELLANEOUS) ×1 IMPLANT
KIT BASIN OR (CUSTOM PROCEDURE TRAY) ×2 IMPLANT
KIT TURNOVER KIT B (KITS) ×2 IMPLANT
NS IRRIG 1000ML POUR BTL (IV SOLUTION) ×2 IMPLANT
PAD ARMBOARD 7.5X6 YLW CONV (MISCELLANEOUS) ×2 IMPLANT
POUCH RETRIEVAL ECOSAC 10 (ENDOMECHANICALS) ×1 IMPLANT
POUCH RETRIEVAL ECOSAC 10MM (ENDOMECHANICALS) ×1
SCISSORS LAP 5X35 DISP (ENDOMECHANICALS) ×2 IMPLANT
SET CHOLANGIOGRAPH 5 50 .035 (SET/KITS/TRAYS/PACK) ×2 IMPLANT
SET IRRIG TUBING LAPAROSCOPIC (IRRIGATION / IRRIGATOR) ×2 IMPLANT
SET TUBE SMOKE EVAC HIGH FLOW (TUBING) ×2 IMPLANT
SLEEVE ENDOPATH XCEL 5M (ENDOMECHANICALS) ×4 IMPLANT
SOL ANTI FOG 6CC (MISCELLANEOUS) ×1 IMPLANT
SOLUTION ANTI FOG 6CC (MISCELLANEOUS) ×1
SPECIMEN JAR SMALL (MISCELLANEOUS) ×2 IMPLANT
SPONGE GAUZE 2X2 STER 10/PKG (GAUZE/BANDAGES/DRESSINGS)
SPONGE T-LAP 18X18 ~~LOC~~+RFID (SPONGE) ×2 IMPLANT
SUT MNCRL AB 4-0 PS2 18 (SUTURE) ×2 IMPLANT
SUT VICRYL 0 UR6 27IN ABS (SUTURE) ×4 IMPLANT
TOWEL GREEN STERILE (TOWEL DISPOSABLE) ×2 IMPLANT
TOWEL GREEN STERILE FF (TOWEL DISPOSABLE) ×2 IMPLANT
TRAY LAPAROSCOPIC MC (CUSTOM PROCEDURE TRAY) ×2 IMPLANT
TROCAR XCEL BLUNT TIP 100MML (ENDOMECHANICALS) ×2 IMPLANT
TROCAR XCEL NON-BLD 5MMX100MML (ENDOMECHANICALS) ×2 IMPLANT
WATER STERILE IRR 1000ML POUR (IV SOLUTION) ×2 IMPLANT

## 2021-06-23 NOTE — Interval H&P Note (Signed)
History and Physical Interval Note:  06/23/2021 7:14 AM  Amber Davies  has presented today for surgery, with the diagnosis of SYMPTOMATIC CHOLELITHIASIS.  The various methods of treatment have been discussed with the patient and family. After consideration of risks, benefits and other options for treatment, the patient has consented to  Procedure(s): LAPAROSCOPIC CHOLECYSTECTOMY WITH POSSIBLE INTRAOPERATIVE CHOLANGIOGRAM (N/A) as a surgical intervention.  The patient's history has been reviewed, patient examined, no change in status, stable for surgery.  I have reviewed the patient's chart and labs.  Questions were answered to the patient's satisfaction.     [name]

## 2021-06-23 NOTE — Op Note (Signed)
Amber Davies [id number] 05/03/1971 06/23/2021  Laparoscopic Cholecystectomy with IOC Procedure Note  Indications: This patient presents with symptomatic gallbladder disease and will undergo laparoscopic cholecystectomy.  Pre-operative Diagnosis: symptomatic cholelithiasis   Post-operative Diagnosis: severe acute calculous cholecystitis  Surgeon: [name] MD FACS  Assistants: none  Anesthesia: General endotracheal anesthesia  Procedure Details  The patient was seen again in the Holding Room. The risks, benefits, complications, treatment options, and expected outcomes were discussed with the patient. The possibilities of reaction to medication, pulmonary aspiration, perforation of viscus, bleeding, recurrent infection, finding a normal gallbladder, the need for additional procedures, failure to diagnose a condition, the possible need to convert to an open procedure, and creating a complication requiring transfusion or operation were discussed with the patient. The likelihood of improving the patient's symptoms with return to their baseline status is good.  The patient and/or family concurred with the proposed plan, giving informed consent. The site of surgery properly noted. The patient was taken to Operating Room, identified as Amber Davies and the procedure verified as Laparoscopic Cholecystectomy with Intraoperative Cholangiogram. A Time Out was held and the above information confirmed. Antibiotic prophylaxis was administered.   Prior to the induction of general anesthesia, antibiotic prophylaxis was administered. General endotracheal anesthesia was then administered and tolerated well. After the induction, the abdomen was prepped with Chloraprep and draped in the sterile fashion. The patient was positioned in the supine position.  Local anesthetic agent was injected into the skin near the umbilicus and an incision made. We dissected down to the abdominal fascia with blunt  dissection.  The fascia was incised vertically and we entered the peritoneal cavity bluntly.  A pursestring suture of 0-Vicryl was placed around the fascial opening.  The Hasson cannula was inserted and secured with the stay suture.  Pneumoperitoneum was then created with CO2 and tolerated well without any adverse changes in the patient's vital signs. An 5-mm port was placed in the subxiphoid position.  Two 5-mm ports were placed in the right upper quadrant. All skin incisions were infiltrated with a local anesthetic agent before making the incision and placing the trocars.   We positioned the patient in reverse Trendelenburg, tilted slightly to the patient's left.  The gallbladder was identified.  There were a fair amount of transverse colon omental adhesions to the body of the gallbladder which were densely adherent.  The gallbladder wall itself was very thickened and inflamed.  The gallbladder was distended., the fundus grasped and retracted cephalad.  In order to facilitate retraction the gallbladder I aspirated it to decompress it.  Adhesions were lysed bluntly and with the electrocautery where indicated, taking care not to injure any adjacent organs or viscus.  She had a stone in her infundibulum which made it a little bit challenging at times to grasped the infundibulum and retract.  The infundibulum was grasped and retracted laterally, exposing the peritoneum overlying the triangle of Calot. This was then divided and exposed in a blunt fashion.  There was a fair amount of inflammation around the infundibulum and cystic duct and cystic artery but with careful dissection using a combination of the suction irrigator catheter and [place] dissector I was able to delineate the cystic duct, the lymph node, and the cystic artery.  She had an anterior branch of the cystic artery.  In order to get a more critical view I elected to go ahead and ligate the anterior branch of the cystic artery with 2 clips proximally  and hook  electrocautery distally as it entered the gallbladder.  A critical view of the cystic duct and cystic artery was obtained.  The cystic duct was clearly identified and bluntly dissected circumferentially. The cystic duct was ligated with a clip distally.   An incision was made in the cystic duct and the [facility] cholangiogram catheter introduced. The catheter was secured using a clip. A cholangiogram was then obtained which showed good visualization of the distal and proximal biliary tree with no sign of filling defects or obstruction.  Contrast flowed easily into the duodenum. The catheter was then removed.   The cystic duct was then ligated with clips and divided. The cystic artery which had been identified & dissected free was ligated with clips and divided as well.   The gallbladder was dissected from the liver bed in retrograde fashion with the electrocautery.  The posterior wall of the gallbladder was fused with the liver surface.  It was slightly intrahepatic.  The gallbladder was entered with some spillage of clear fluid.  No stones were spilled.  The gallbladder was removed and placed in an Ecco sac.  The gallbladder and Ecco sac were then removed through the umbilical port site. The liver bed was irrigated and inspected. Hemostasis was achieved with the electrocautery. Copious irrigation was utilized and was repeatedly aspirated until clear.  I did place a piece of surgical snow in the gallbladder fossa.  The pursestring suture was used to close the umbilical fascia.  There was an air leak.  2 additional interrupted 0 Vicryl's were placed at the umbilical fascia with a PMI suture passer using laparoscopic guidance.  We again inspected the right upper quadrant for hemostasis.  The umbilical closure was inspected and there was no air leak and nothing trapped within the closure. Pneumoperitoneum was released as we removed the trocars.  4-0 Monocryl was used to close the skin.   Dermabond was  applied. The patient was then extubated and brought to the recovery room in stable condition. Instrument, sponge, and needle counts were correct at closure and at the conclusion of the case.   Findings: Acute on chronic cholecystitis with Cholelithiasis Normal IOC Positive snow  Estimated Blood Loss: less than 50 mL         Drains: None         Specimens: Gallbladder           Complications: None; patient tolerated the procedure well.         Disposition: PACU - hemodynamically stable.         Condition: stable  Leighton Ruff. Redmond Pulling, MD, FACS General, Bariatric, & Minimally Invasive Surgery [facility], [place]

## 2021-06-23 NOTE — Anesthesia Postprocedure Evaluation (Signed)
Anesthesia Post Note  Patient: Amber Davies  Procedure(s) Performed: LAPAROSCOPIC CHOLECYSTECTOMY (Abdomen) INTRAOPERATIVE CHOLANGIOGRAM (Abdomen)     Patient location during evaluation: PACU Anesthesia Type: General Level of consciousness: awake and alert and oriented Pain management: pain level controlled Vital Signs Assessment: post-procedure vital signs reviewed and stable Respiratory status: spontaneous breathing, nonlabored ventilation and respiratory function stable Cardiovascular status: blood pressure returned to baseline and stable Postop Assessment: no apparent nausea or vomiting Anesthetic complications: no   No notable events documented.  Last Vitals:  Vitals:   06/23/21 0945 06/23/21 1000  BP: (!) 178/86 (!) 182/93  Pulse: 72 80  Resp: 17 15  Temp:    SpO2: 92% 97%    Last Pain:  Vitals:   06/23/21 1006  TempSrc:   PainSc: 4                  Nathalia Wismer A.

## 2021-06-23 NOTE — Transfer of Care (Signed)
Immediate Anesthesia Transfer of Care Note  Patient: Amber Davies  Procedure(s) Performed: LAPAROSCOPIC CHOLECYSTECTOMY (Abdomen) INTRAOPERATIVE CHOLANGIOGRAM (Abdomen)  Patient Location: PACU  Anesthesia Type:General  Level of Consciousness: awake, alert  and oriented  Airway & Oxygen Therapy: Patient Spontanous Breathing  Post-op Assessment: Report given to RN, Post -op Vital signs reviewed and stable and Patient moving all extremities  Post vital signs: Reviewed and stable  Last Vitals:  Vitals Value Taken Time  BP 163/87 06/23/21 0931  Temp 37.3 C 06/23/21 0930  Pulse 82 06/23/21 0940  Resp 13 06/23/21 0940  SpO2 91 % 06/23/21 0940  Vitals shown include unvalidated device data.  Last Pain:  Vitals:   06/23/21 0930  TempSrc:   PainSc: Asleep         Complications: No notable events documented.

## 2021-06-23 NOTE — Anesthesia Procedure Notes (Signed)
Procedure Name: Intubation Date/Time: 06/23/2021 7:27 AM Performed by: Amadeo Garnet, CRNA Pre-anesthesia Checklist: Patient identified, Emergency Drugs available, Suction available and Patient being monitored Patient Re-evaluated:Patient Re-evaluated prior to induction Oxygen Delivery Method: Circle system utilized Preoxygenation: Pre-oxygenation with 100% oxygen Induction Type: IV induction Ventilation: Mask ventilation without difficulty Laryngoscope Size: Mac and 4 Grade View: Grade I Tube type: Oral Tube size: 7.0 mm Number of attempts: 1 Airway Equipment and Method: Stylet and Oral airway Placement Confirmation: ETT inserted through vocal cords under direct vision, positive ETCO2 and breath sounds checked- equal and bilateral Secured at: 21 cm Tube secured with: Tape Dental Injury: Teeth and Oropharynx as per pre-operative assessment

## 2021-06-23 NOTE — Discharge Instructions (Signed)
[facility] LAPAROSCOPIC SURGERY: POST OP INSTRUCTIONS Always review your discharge instruction sheet given to you by the facility where your surgery was performed. IF YOU HAVE DISABILITY OR FAMILY LEAVE FORMS, YOU MUST BRING THEM TO THE OFFICE FOR PROCESSING.   DO NOT GIVE THEM TO YOUR DOCTOR.  PAIN CONTROL  First take acetaminophen (Tylenol) AND/or ibuprofen (Advil) to control your pain after surgery.  Follow directions on package.  Taking acetaminophen (Tylenol) and/or ibuprofen (Advil) regularly after surgery will help to control your pain and lower the amount of prescription pain medication you may need.  You should not take more than 3,000 mg (3 grams) of acetaminophen (Tylenol) in 24 hours.  You should not take ibuprofen (Advil), aleve, motrin, naprosyn or other NSAIDS if you have a history of stomach ulcers or chronic kidney disease.  A prescription for pain medication may be given to you upon discharge.  Take your pain medication as prescribed, if you still have uncontrolled pain after taking acetaminophen (Tylenol) or ibuprofen (Advil). Use ice packs to help control pain. If you need a refill on your pain medication, please contact your pharmacy.  They will contact our office to request authorization. Prescriptions will not be filled after 5pm or on week-ends.  HOME MEDICATIONS Take your usually prescribed medications unless otherwise directed.  DIET You should follow a light diet the first few days after arrival home.  Be sure to include lots of fluids daily. Avoid fatty, fried foods.   CONSTIPATION It is common to experience some constipation after surgery and if you are taking pain medication.  Increasing fluid intake and taking a stool softener (such as Colace) will usually help or prevent this problem from occurring.  A mild laxative (Milk of Magnesia or Miralax) should be taken according to package instructions if there are no bowel movements after 48  hours.  WOUND/INCISION CARE Most patients will experience some swelling and bruising in the area of the incisions.  Ice packs will help.  Swelling and bruising can take several days to resolve.  Unless discharge instructions indicate otherwise, follow guidelines below  STERI-STRIPS - you may remove your outer bandages 48 hours after surgery, and you may shower at that time.  You have steri-strips (small skin tapes) in place directly over the incision.  These strips should be left on the skin for 7-10 days.   DERMABOND/SKIN GLUE - you may shower in 24 hours.  The glue will flake off over the next 2-3 weeks. Any sutures or staples will be removed at the office during your follow-up visit.  ACTIVITIES You may resume regular (light) daily activities beginning the next day--such as daily self-care, walking, climbing stairs--gradually increasing activities as tolerated.  You may have sexual intercourse when it is comfortable.  Refrain from any heavy lifting or straining until approved by your doctor. You may drive when you are no longer taking prescription pain medication, you can comfortably wear a seatbelt, and you can safely maneuver your car and apply brakes.  FOLLOW-UP You should see your doctor in the office for a follow-up appointment approximately 2-3 weeks after your surgery.  You should have been given your post-op/follow-up appointment when your surgery was scheduled.  If you did not receive a post-op/follow-up appointment, make sure that you call for this appointment within a day or two after you arrive home to insure a convenient appointment time.  OTHER INSTRUCTIONS   WHEN TO CALL YOUR DOCTOR: Fever over 101.0 Inability to urinate Continued   bleeding from incision. Increased pain, redness, or drainage from the incision. Increasing abdominal pain  The clinic staff is available to answer your questions during regular business hours.  Please don't hesitate to call and ask to speak to  one of the nurses for clinical concerns.  If you have a medical emergency, go to the nearest emergency room or call 911.  A surgeon from [facility] is always on call at the hospital. [address], [address], [place], [place]  [address] ? [address] [address], [place], [place]   [address] [phone] ? [phone] ? FAX [phone] Web site: [email or web address]  

## 2021-06-24 ENCOUNTER — Encounter (HOSPITAL_COMMUNITY): Payer: Self-pay | Admitting: General Surgery

## 2021-06-26 LAB — SURGICAL PATHOLOGY

## 2021-07-02 ENCOUNTER — Other Ambulatory Visit: Payer: Self-pay | Admitting: General Surgery

## 2021-07-02 DIAGNOSIS — K8020 Calculus of gallbladder without cholecystitis without obstruction: Secondary | ICD-10-CM

## 2021-07-03 ENCOUNTER — Other Ambulatory Visit: Payer: Self-pay

## 2021-07-03 ENCOUNTER — Ambulatory Visit
Admission: RE | Admit: 2021-07-03 | Discharge: 2021-07-03 | Disposition: A | Discharge status: Home / Self Care | Payer: BC Managed Care – PPO | Source: Ambulatory Visit | Attending: General Surgery | Admitting: General Surgery

## 2021-07-03 DIAGNOSIS — R932 Abnormal findings on diagnostic imaging of liver and biliary tract: Secondary | ICD-10-CM | POA: Diagnosis not present

## 2021-07-03 DIAGNOSIS — K8020 Calculus of gallbladder without cholecystitis without obstruction: Secondary | ICD-10-CM

## 2021-07-03 MED ORDER — GADOBENATE DIMEGLUMINE 529 MG/ML IV SOLN
17.0000 mL | Freq: Once | INTRAVENOUS | Status: AC | PRN
  Administered 2021-07-03: 17 mL via INTRAVENOUS

## 2021-07-08 ENCOUNTER — Telehealth: Payer: Self-pay

## 2021-07-08 NOTE — Telephone Encounter (Signed)
The pt has been scheduled to see Dr Ardis Hughs on 12/9 at 210 pm.  She has been advised and asked to arrive 15 min early The pt has been advised of the information and verbalized understanding.

## 2021-07-08 NOTE — Telephone Encounter (Signed)
-----   Message from Milus Banister, MD sent at 07/08/2021  2:44 PM EST ----- Regarding: RE: needs ercp Alinda Money reach out to her.  Looks like her LFts were normal last week.  Probably best to meet her in the office first. If she has biliary troubles before then she should go to ER for expedited care.  Thanks  Marcine Gadway, Please contact her and offer her the first available appointment with one of the ERCPers in our group (me, Gabe, Dr. Henrene Pastor, Dr. Fuller Plan or Dr. Carlean Purl).   Thanks DJ   ----- Message ----- From: [name], MD Sent: 07/08/2021   2:38 PM EST To: Milus Banister, MD, Irene Shipper, MD, # Subject: needs ercp                                     Hi guys,  I did a lap chole with ioc on this lady 11/22  Her gallbladder definitely had some difficult chronic cholecystitis to dissect around.  I interpreted her IOC is normal; however, radiology thought there was a filling defect-nonocclusive in the distal common bile duct and recommended ERCP.  I ordered an MRI.  This shows a persistent concern in the distal common bile duct either thick sludge versus a potential lesion.  Was hoping she could get in with someone in [place] to do ERCP.  Thanks Park Breed M. Redmond Pulling, MD, FACS General, Bariatric, & Minimally Invasive Surgery [facility], [place]

## 2021-07-09 DIAGNOSIS — L821 Other seborrheic keratosis: Secondary | ICD-10-CM | POA: Diagnosis not present

## 2021-07-09 DIAGNOSIS — L578 Other skin changes due to chronic exposure to nonionizing radiation: Secondary | ICD-10-CM | POA: Diagnosis not present

## 2021-07-09 DIAGNOSIS — D1801 Hemangioma of skin and subcutaneous tissue: Secondary | ICD-10-CM | POA: Diagnosis not present

## 2021-07-10 ENCOUNTER — Ambulatory Visit (INDEPENDENT_AMBULATORY_CARE_PROVIDER_SITE_OTHER): Payer: BC Managed Care – PPO | Admitting: Gastroenterology

## 2021-07-10 ENCOUNTER — Encounter: Payer: Self-pay | Admitting: Gastroenterology

## 2021-07-10 ENCOUNTER — Other Ambulatory Visit (INDEPENDENT_AMBULATORY_CARE_PROVIDER_SITE_OTHER): Payer: BC Managed Care – PPO

## 2021-07-10 VITALS — BP 106/60 | HR 72 | Ht 66.5 in | Wt 178.2 lb

## 2021-07-10 DIAGNOSIS — K839 Disease of biliary tract, unspecified: Secondary | ICD-10-CM

## 2021-07-10 LAB — COMPREHENSIVE METABOLIC PANEL
ALT: 69 U/L — ABNORMAL HIGH (ref 0–35)
AST: 37 U/L (ref 0–37)
Albumin: 4.1 g/dL (ref 3.5–5.2)
Alkaline Phosphatase: 144 U/L — ABNORMAL HIGH (ref 39–117)
BUN: 8 mg/dL (ref 6–23)
CO2: 29 mEq/L (ref 19–32)
Calcium: 9.5 mg/dL (ref 8.4–10.5)
Chloride: 105 mEq/L (ref 96–112)
Creatinine, Ser: 0.73 mg/dL (ref 0.40–1.20)
GFR: 96.57 mL/min (ref >60.00)
Glucose, Bld: 99 mg/dL (ref 70–99)
Potassium: 4.5 mEq/L (ref 3.5–5.1)
Sodium: 140 mEq/L (ref 135–145)
Total Bilirubin: 0.3 mg/dL (ref 0.2–1.2)
Total Protein: 7.1 g/dL (ref 6.0–8.3)

## 2021-07-10 LAB — CBC
HCT: 38.9 % (ref 36.0–46.0)
Hemoglobin: 13.2 g/dL (ref 12.0–15.0)
MCHC: 34 g/dL (ref 30.0–36.0)
MCV: 91.6 fl (ref 78.0–100.0)
Platelets: 296 10*3/uL (ref 150.0–400.0)
RBC: 4.25 Mil/uL (ref 3.87–5.11)
RDW: 12.8 % (ref 11.5–15.5)
WBC: 4.9 10*3/uL (ref 4.0–10.5)

## 2021-07-10 NOTE — Progress Notes (Signed)
HPI: This is a very pleasant 50 year old woman who was referred by Dr. [name] from [facility] surgery for abnormal bile duct  She underwent laparoscopic cholecystectomy for symptomatic cholelithiasis recently.  Postoperative impression was that she had "severe acute calculus cholecystitis" Dr. Redmond Pulling felt that the [facility] was fairly normal however radiologist reported "persistent nonocclusive eccentric filling defect within the distal aspect of the CBD, potentially associated with the ampulla, though conceivably nonocclusive choledocholithiasis could have a similar appearance."  He arranged MRI with MRCP and the radiologist impression reads "persistent abnormality along the right lateral wall of the distal common bile duct corresponding to the abnormality seen on the IOC, signal characteristics do not appear to be compatible with a stone.  Small amount of sludge adherent to this area or a periampullary lesion are both considered."  Blood work October 2022 showed normal complete metabolic profile, hemoglobin 15.6.  Since her laparoscopic cholecystectomy she has felt overall lethargic and just with some generalized abdominal discomforts.  She has fortunately not had any acute attacks of biliary colic like she suffered from before the surgery.  Review of systems: Pertinent positive and negative review of systems were noted in the above HPI section. All other review negative.   Past Medical History:  Diagnosis Date   COVID 07/2019   Gallstones     Past Surgical History:  Procedure Laterality Date   CHOLECYSTECTOMY N/A 06/23/2021   Procedure: LAPAROSCOPIC CHOLECYSTECTOMY;  Surgeon: [name], MD;  Location: [facility];  Service: General;  Laterality: N/A;   INTRAOPERATIVE CHOLANGIOGRAM N/A 06/23/2021   Procedure: INTRAOPERATIVE CHOLANGIOGRAM;  Surgeon: [name], MD;  Location: [facility];  Service: General;  Laterality: N/A;   WISDOM TOOTH EXTRACTION      Current Outpatient Medications   Medication Sig Dispense Refill   levonorgestrel (MIRENA) 20 MCG/DAY IUD 1 each by Intrauterine route once.     melatonin 5 MG TABS Take 5 mg by mouth at bedtime as needed (sleep).     loratadine (CLARITIN) 10 MG tablet Take 10 mg by mouth daily as needed for allergies. (Patient not taking: Reported on 07/10/2021)     oxyCODONE (OXY IR/ROXICODONE) 5 MG immediate release tablet Take 1 tablet (5 mg total) by mouth every 6 (six) hours as needed for severe pain. (Patient not taking: Reported on 07/10/2021) 15 tablet 0   No current facility-administered medications for this visit.    Allergies as of 07/10/2021   (No Known Allergies)    History reviewed. No pertinent family history.  Social History   Socioeconomic History   Marital status: Married    Spouse name: Not on file   Number of children: Not on file   Years of education: Not on file   Highest education level: Not on file  Occupational History   Not on file  Tobacco Use   Smoking status: Former    Types: Cigarettes   Smokeless tobacco: Never  Vaping Use   Vaping Use: Never used  Substance and Sexual Activity   Alcohol use: Yes    Comment: rare   Drug use: Not Currently   Sexual activity: Yes  Other Topics Concern   Not on file  Social History Narrative   Not on file   Social Determinants of Health   Financial Resource Strain: Not on file  Food Insecurity: Not on file  Transportation Needs: Not on file  Physical Activity: Not on file  Stress: Not on file  Social Connections: Not on file  Intimate Partner  Violence: Not on file     Physical Exam: Ht 5' 6.5" (1.689 m) Comment: height measured without shoes  Wt 178 lb 4 oz (80.9 kg)   LMP 06/14/2021   BMI 28.34 kg/m  Constitutional: generally well-appearing Psychiatric: alert and oriented x3 Eyes: extraocular movements intact Mouth: oral pharynx moist, no lesions Neck: supple no lymphadenopathy Cardiovascular: heart regular rate and rhythm Lungs: clear to  auscultation bilaterally Abdomen: soft, nontender, nondistended, no obvious ascites, no peritoneal signs, normal bowel sounds Extremities: no lower extremity edema bilaterally Skin: no lesions on visible extremities   Assessment and plan: 50 y.o. female with abnormal bile duct on IOC and MRCP  Not clear to me if she actually has bile duct pathology but I think we should proceed with further testing to be more certain.  She will get a basic set of labs including CBC and complete metabolic profile.  If her liver tests are significantly elevated then I will plan to perform an ERCP for her next week.  If her labs are underwhelming and relatively normal then I am planning endoscopic ultrasound plus minus ERCP next available which would probably be several weeks from now.  We are going to tentatively book her for the endoscopic ultrasound plus minus ERCP procedure for several weeks from now, first available and she knows that if her liver tests are significantly elevated we will pivot and proceed with straight to ERCP next Thursday.  Please see the "Patient Instructions" section for addition details about the plan.   Owens Loffler, MD [facility] 07/10/2021, 2:37 PM  Cc: Aretta Nip, MD  Total time on date of encounter was 45 minutes (this included time spent preparing to see the patient reviewing records; obtaining and/or reviewing separately obtained history; performing a medically appropriate exam and/or evaluation; counseling and educating the patient and family if present; ordering medications, tests or procedures if applicable; and documenting clinical information in the health record).

## 2021-07-10 NOTE — Patient Instructions (Signed)
If you are age 49 or younger, your body mass index should be between 19-25. Your Body mass index is 28.34 kg/m. If this is out of the aformentioned range listed, please consider follow up with your Primary Care Provider.  _______________________________________________________  The [facility] providers would like to encourage you to use [facility] to communicate with providers for non-urgent requests or questions.  Due to long hold times on the telephone, sending your provider a message by [facility] may be a faster and more efficient way to get a response.  Please allow 48 business hours for a response.  Please remember that this is for non-urgent requests.  _______________________________________________________  Your provider has requested that you go to the basement level for lab work before leaving today. Press "B" on the elevator. The lab is located at the first door on the left as you exit the elevator.  You have been scheduled for an Upper EUS and ERCP. Please follow written instructions given to you at your visit today. If you use inhalers (even only as needed), please bring them with you on the day of your procedure.  Due to recent changes in healthcare laws, you may see the results of your imaging and laboratory studies on MyChart before your provider has had a chance to review them.  We understand that in some cases there may be results that are confusing or concerning to you. Not all laboratory results come back in the same time frame and the provider may be waiting for multiple results in order to interpret others.  Please give [place] 48 hours in order for your provider to thoroughly review all the results before contacting the office for clarification of your results.   REMINDER: If lab results are abnormal today, you will be contacted to have ERCP sooner.  Thank you for entrusting me with your care and choosing [facility].  Dr Ardis Hughs

## 2021-07-10 NOTE — H&P (View-Only) (Signed)
HPI: This is a very pleasant 50 year old woman who was referred by Dr. [name] from [facility] surgery for abnormal bile duct  She underwent laparoscopic cholecystectomy for symptomatic cholelithiasis recently.  Postoperative impression was that she had "severe acute calculus cholecystitis" Dr. Redmond Pulling felt that the [facility] was fairly normal however radiologist reported "persistent nonocclusive eccentric filling defect within the distal aspect of the CBD, potentially associated with the ampulla, though conceivably nonocclusive choledocholithiasis could have a similar appearance."  He arranged MRI with MRCP and the radiologist impression reads "persistent abnormality along the right lateral wall of the distal common bile duct corresponding to the abnormality seen on the IOC, signal characteristics do not appear to be compatible with a stone.  Small amount of sludge adherent to this area or a periampullary lesion are both considered."  Blood work October 2022 showed normal complete metabolic profile, hemoglobin 15.6.  Since her laparoscopic cholecystectomy she has felt overall lethargic and just with some generalized abdominal discomforts.  She has fortunately not had any acute attacks of biliary colic like she suffered from before the surgery.  Review of systems: Pertinent positive and negative review of systems were noted in the above HPI section. All other review negative.   Past Medical History:  Diagnosis Date   COVID 07/2019   Gallstones     Past Surgical History:  Procedure Laterality Date   CHOLECYSTECTOMY N/A 06/23/2021   Procedure: LAPAROSCOPIC CHOLECYSTECTOMY;  Surgeon: [name], MD;  Location: [place];  Service: General;  Laterality: N/A;   INTRAOPERATIVE CHOLANGIOGRAM N/A 06/23/2021   Procedure: INTRAOPERATIVE CHOLANGIOGRAM;  Surgeon: [name], MD;  Location: [place];  Service: General;  Laterality: N/A;   WISDOM TOOTH EXTRACTION      Current Outpatient Medications   Medication Sig Dispense Refill   levonorgestrel (MIRENA) 20 MCG/DAY IUD 1 each by Intrauterine route once.     melatonin 5 MG TABS Take 5 mg by mouth at bedtime as needed (sleep).     loratadine (CLARITIN) 10 MG tablet Take 10 mg by mouth daily as needed for allergies. (Patient not taking: Reported on 07/10/2021)     oxyCODONE (OXY IR/ROXICODONE) 5 MG immediate release tablet Take 1 tablet (5 mg total) by mouth every 6 (six) hours as needed for severe pain. (Patient not taking: Reported on 07/10/2021) 15 tablet 0   No current facility-administered medications for this visit.    Allergies as of 07/10/2021   (No Known Allergies)    History reviewed. No pertinent family history.  Social History   Socioeconomic History   Marital status: Married    Spouse name: Not on file   Number of children: Not on file   Years of education: Not on file   Highest education level: Not on file  Occupational History   Not on file  Tobacco Use   Smoking status: Former    Types: Cigarettes   Smokeless tobacco: Never  Vaping Use   Vaping Use: Never used  Substance and Sexual Activity   Alcohol use: Yes    Comment: rare   Drug use: Not Currently   Sexual activity: Yes  Other Topics Concern   Not on file  Social History Narrative   Not on file   Social Determinants of Health   Financial Resource Strain: Not on file  Food Insecurity: Not on file  Transportation Needs: Not on file  Physical Activity: Not on file  Stress: Not on file  Social Connections: Not on file  Intimate Partner  Violence: Not on file     Physical Exam: Ht 5' 6.5" (1.689 m) Comment: height measured without shoes   Wt 178 lb 4 oz (80.9 kg)    LMP 06/14/2021    BMI 28.34 kg/m  Constitutional: generally well-appearing Psychiatric: alert and oriented x3 Eyes: extraocular movements intact Mouth: oral pharynx moist, no lesions Neck: supple no lymphadenopathy Cardiovascular: heart regular rate and rhythm Lungs: clear to  auscultation bilaterally Abdomen: soft, nontender, nondistended, no obvious ascites, no peritoneal signs, normal bowel sounds Extremities: no lower extremity edema bilaterally Skin: no lesions on visible extremities   Assessment and plan: 50 y.o. female with abnormal bile duct on IOC and MRCP  Not clear to me if she actually has bile duct pathology but I think we should proceed with further testing to be more certain.  She will get a basic set of labs including CBC and complete metabolic profile.  If her liver tests are significantly elevated then I will plan to perform an ERCP for her next week.  If her labs are underwhelming and relatively normal then I am planning endoscopic ultrasound plus minus ERCP next available which would probably be several weeks from now.  We are going to tentatively book her for the endoscopic ultrasound plus minus ERCP procedure for several weeks from now, first available and she knows that if her liver tests are significantly elevated we will pivot and proceed with straight to ERCP next Thursday.  Please see the "Patient Instructions" section for addition details about the plan.   Owens Loffler, MD [facility] 07/10/2021, 2:37 PM  Cc: Aretta Nip, MD  Total time on date of encounter was 45 minutes (this included time spent preparing to see the patient reviewing records; obtaining and/or reviewing separately obtained history; performing a medically appropriate exam and/or evaluation; counseling and educating the patient and family if present; ordering medications, tests or procedures if applicable; and documenting clinical information in the health record).

## 2021-07-13 ENCOUNTER — Other Ambulatory Visit: Payer: Self-pay

## 2021-07-13 ENCOUNTER — Other Ambulatory Visit (INDEPENDENT_AMBULATORY_CARE_PROVIDER_SITE_OTHER): Payer: BC Managed Care – PPO

## 2021-07-13 DIAGNOSIS — R7989 Other specified abnormal findings of blood chemistry: Secondary | ICD-10-CM

## 2021-07-13 DIAGNOSIS — K839 Disease of biliary tract, unspecified: Secondary | ICD-10-CM

## 2021-07-13 LAB — HEPATIC FUNCTION PANEL
ALT: 40 U/L — ABNORMAL HIGH (ref 0–35)
AST: 20 U/L (ref 0–37)
Albumin: 4.1 g/dL (ref 3.5–5.2)
Alkaline Phosphatase: 141 U/L — ABNORMAL HIGH (ref 39–117)
Bilirubin, Direct: 0.1 mg/dL (ref 0.0–0.3)
Total Bilirubin: 0.4 mg/dL (ref 0.2–1.2)
Total Protein: 7.1 g/dL (ref 6.0–8.3)

## 2021-07-14 ENCOUNTER — Encounter (HOSPITAL_COMMUNITY): Payer: Self-pay | Admitting: Gastroenterology

## 2021-07-14 NOTE — Progress Notes (Signed)
Attempted to obtain medical history via telephone, unable to reach at this time. I left a voicemail to return pre surgical testing department's phone call.  

## 2021-07-16 ENCOUNTER — Other Ambulatory Visit: Payer: Self-pay

## 2021-07-16 ENCOUNTER — Telehealth: Payer: Self-pay

## 2021-07-16 ENCOUNTER — Ambulatory Visit (HOSPITAL_COMMUNITY): Payer: BC Managed Care – PPO

## 2021-07-16 ENCOUNTER — Ambulatory Visit (HOSPITAL_COMMUNITY): Payer: BC Managed Care – PPO | Admitting: Anesthesiology

## 2021-07-16 ENCOUNTER — Encounter (HOSPITAL_COMMUNITY)
Admission: RE | Disposition: A | Discharge status: Home / Self Care | Payer: Self-pay | Source: Ambulatory Visit | Attending: Gastroenterology

## 2021-07-16 ENCOUNTER — Ambulatory Visit (HOSPITAL_COMMUNITY)
Admission: RE | Admit: 2021-07-16 | Discharge: 2021-07-16 | Disposition: A | Discharge status: Home / Self Care | Payer: BC Managed Care – PPO | Source: Ambulatory Visit | Attending: Gastroenterology | Admitting: Gastroenterology

## 2021-07-16 ENCOUNTER — Encounter (HOSPITAL_COMMUNITY): Payer: Self-pay | Admitting: Gastroenterology

## 2021-07-16 DIAGNOSIS — R932 Abnormal findings on diagnostic imaging of liver and biliary tract: Secondary | ICD-10-CM | POA: Insufficient documentation

## 2021-07-16 DIAGNOSIS — Z9049 Acquired absence of other specified parts of digestive tract: Secondary | ICD-10-CM | POA: Insufficient documentation

## 2021-07-16 DIAGNOSIS — E7800 Pure hypercholesterolemia, unspecified: Secondary | ICD-10-CM | POA: Insufficient documentation

## 2021-07-16 DIAGNOSIS — R7989 Other specified abnormal findings of blood chemistry: Secondary | ICD-10-CM

## 2021-07-16 DIAGNOSIS — K839 Disease of biliary tract, unspecified: Secondary | ICD-10-CM

## 2021-07-16 DIAGNOSIS — Z87891 Personal history of nicotine dependence: Secondary | ICD-10-CM | POA: Diagnosis not present

## 2021-07-16 DIAGNOSIS — Z9889 Other specified postprocedural states: Secondary | ICD-10-CM | POA: Diagnosis not present

## 2021-07-16 DIAGNOSIS — R748 Abnormal levels of other serum enzymes: Secondary | ICD-10-CM

## 2021-07-16 DIAGNOSIS — R933 Abnormal findings on diagnostic imaging of other parts of digestive tract: Secondary | ICD-10-CM

## 2021-07-16 DIAGNOSIS — K838 Other specified diseases of biliary tract: Secondary | ICD-10-CM | POA: Diagnosis not present

## 2021-07-16 HISTORY — PX: ENDOSCOPIC RETROGRADE CHOLANGIOPANCREATOGRAPHY (ERCP) WITH PROPOFOL: SHX5810

## 2021-07-16 HISTORY — PX: SPHINCTEROTOMY: SHX5544

## 2021-07-16 SURGERY — ENDOSCOPIC RETROGRADE CHOLANGIOPANCREATOGRAPHY (ERCP) WITH PROPOFOL
Anesthesia: General

## 2021-07-16 MED ORDER — GLUCAGON HCL RDNA (DIAGNOSTIC) 1 MG IJ SOLR
INTRAMUSCULAR | Status: AC
  Filled 2021-07-16: qty 2

## 2021-07-16 MED ORDER — SUGAMMADEX SODIUM 200 MG/2ML IV SOLN
INTRAVENOUS | Status: DC | PRN
  Administered 2021-07-16: 200 mg via INTRAVENOUS

## 2021-07-16 MED ORDER — CIPROFLOXACIN IN D5W 400 MG/200ML IV SOLN
INTRAVENOUS | Status: AC
  Filled 2021-07-16: qty 200

## 2021-07-16 MED ORDER — LACTATED RINGERS IV SOLN: INTRAVENOUS | Status: DC

## 2021-07-16 MED ORDER — INDOMETHACIN 50 MG RE SUPP
RECTAL | Status: AC
  Filled 2021-07-16: qty 2

## 2021-07-16 MED ORDER — SODIUM CHLORIDE 0.9 % IV SOLN: INTRAVENOUS | Status: DC

## 2021-07-16 MED ORDER — INDOMETHACIN 25 MG PO CAPS
ORAL_CAPSULE | ORAL | Status: DC | PRN
  Administered 2021-07-16: 100 mg via ORAL

## 2021-07-16 MED ORDER — MIDAZOLAM HCL 5 MG/5ML IJ SOLN
INTRAMUSCULAR | Status: DC | PRN
  Administered 2021-07-16: 2 mg via INTRAVENOUS

## 2021-07-16 MED ORDER — DEXAMETHASONE SODIUM PHOSPHATE 4 MG/ML IJ SOLN
INTRAMUSCULAR | Status: DC | PRN
  Administered 2021-07-16: 10 mg via INTRAVENOUS

## 2021-07-16 MED ORDER — PROPOFOL 10 MG/ML IV BOLUS
INTRAVENOUS | Status: AC
  Filled 2021-07-16: qty 20

## 2021-07-16 MED ORDER — ONDANSETRON HCL 4 MG/2ML IJ SOLN
INTRAMUSCULAR | Status: DC | PRN
  Administered 2021-07-16: 4 mg via INTRAVENOUS

## 2021-07-16 MED ORDER — FENTANYL CITRATE (PF) 100 MCG/2ML IJ SOLN
INTRAMUSCULAR | Status: DC | PRN
  Administered 2021-07-16 (×2): 50 ug via INTRAVENOUS

## 2021-07-16 MED ORDER — MIDAZOLAM HCL 2 MG/2ML IJ SOLN
INTRAMUSCULAR | Status: AC
  Filled 2021-07-16: qty 2

## 2021-07-16 MED ORDER — PROPOFOL 10 MG/ML IV BOLUS
INTRAVENOUS | Status: DC | PRN
  Administered 2021-07-16: 200 mg via INTRAVENOUS

## 2021-07-16 MED ORDER — CIPROFLOXACIN IN D5W 400 MG/200ML IV SOLN
INTRAVENOUS | Status: DC | PRN
  Administered 2021-07-16: 400 mg via INTRAVENOUS

## 2021-07-16 MED ORDER — FENTANYL CITRATE (PF) 100 MCG/2ML IJ SOLN
INTRAMUSCULAR | Status: AC
  Filled 2021-07-16: qty 2

## 2021-07-16 MED ORDER — LIDOCAINE 2% (20 MG/ML) 5 ML SYRINGE
INTRAMUSCULAR | Status: DC | PRN
  Administered 2021-07-16: 60 mg via INTRAVENOUS

## 2021-07-16 MED ORDER — ROCURONIUM BROMIDE 10 MG/ML (PF) SYRINGE
PREFILLED_SYRINGE | INTRAVENOUS | Status: DC | PRN
  Administered 2021-07-16: 60 mg via INTRAVENOUS

## 2021-07-16 NOTE — Anesthesia Postprocedure Evaluation (Signed)
Anesthesia Post Note  Patient: BYRON TIPPING  Procedure(s) Performed: ENDOSCOPIC RETROGRADE CHOLANGIOPANCREATOGRAPHY (ERCP) WITH PROPOFOL SPHINCTEROTOMY     Patient location during evaluation: PACU Anesthesia Type: General Level of consciousness: awake and alert Pain management: pain level controlled Vital Signs Assessment: post-procedure vital signs reviewed and stable Respiratory status: spontaneous breathing, nonlabored ventilation, respiratory function stable and patient connected to nasal cannula oxygen Cardiovascular status: blood pressure returned to baseline and stable Postop Assessment: no apparent nausea or vomiting Anesthetic complications: no   No notable events documented.  Last Vitals:  Vitals:   07/16/21 1040 07/16/21 1044  BP: (!) 158/110 (!) 161/88  Pulse: 76 72  Resp: 18 14  Temp:    SpO2: 96% 97%    Last Pain:  Vitals:   07/16/21 0859  TempSrc: Oral  PainSc: 0-No pain                 Isabela Nardelli

## 2021-07-16 NOTE — Telephone Encounter (Signed)
-----   Message from Milus Banister, MD sent at 07/16/2021 10:32 AM EST ----- Hey, Just completed her ERCP, pretty underwhelming but I did a sphincterotomy to be sefe.  See full report in epic.   Gurshan Settlemire, She needs lfts in 1 months.  thanks

## 2021-07-16 NOTE — Interval H&P Note (Signed)
History and Physical Interval Note:  07/16/2021 8:44 AM  Amber Davies  has presented today for surgery, with the diagnosis of bile duct abnormality.  The various methods of treatment have been discussed with the patient and family. After consideration of risks, benefits and other options for treatment, the patient has consented to  Procedure(s): ENDOSCOPIC RETROGRADE CHOLANGIOPANCREATOGRAPHY (ERCP) WITH PROPOFOL (N/A) as a surgical intervention.  The patient's history has been reviewed, patient examined, no change in status, stable for surgery.  I have reviewed the patient's chart and labs.  Questions were answered to the patient's satisfaction.     Milus Banister

## 2021-07-16 NOTE — Transfer of Care (Signed)
Immediate Anesthesia Transfer of Care Note  Patient: Amber Davies  Procedure(s) Performed: ENDOSCOPIC RETROGRADE CHOLANGIOPANCREATOGRAPHY (ERCP) WITH PROPOFOL SPHINCTEROTOMY  Patient Location: Endoscopy Unit  Anesthesia Type:General  Level of Consciousness: awake  Airway & Oxygen Therapy: Patient Spontanous Breathing and Patient connected to face mask oxygen  Post-op Assessment: Report given to RN and Post -op Vital signs reviewed and stable  Post vital signs: Reviewed and stable  Last Vitals:  Vitals Value Taken Time  BP    Temp    Pulse 85 07/16/21 1025  Resp 15 07/16/21 1025  SpO2 100 % 07/16/21 1025    Last Pain:  Vitals:   07/16/21 0859  TempSrc: Oral  PainSc: 0-No pain         Complications: No notable events documented.

## 2021-07-16 NOTE — Telephone Encounter (Signed)
I have entered the labs in Epic for 1 month LFT

## 2021-07-16 NOTE — Anesthesia Procedure Notes (Signed)
Procedure Name: Intubation Date/Time: 07/16/2021 9:40 AM Performed by: Lieutenant Diego, CRNA Pre-anesthesia Checklist: Patient identified, Emergency Drugs available, Suction available and Patient being monitored Patient Re-evaluated:Patient Re-evaluated prior to induction Oxygen Delivery Method: Circle system utilized Preoxygenation: Pre-oxygenation with 100% oxygen Induction Type: IV induction Ventilation: Mask ventilation without difficulty Laryngoscope Size: Miller and 2 Grade View: Grade I Tube type: Oral Tube size: 7.0 mm Number of attempts: 1 Airway Equipment and Method: Stylet and Oral airway Placement Confirmation: ETT inserted through vocal cords under direct vision, positive ETCO2 and breath sounds checked- equal and bilateral Secured at: 23 cm Tube secured with: Tape Dental Injury: Teeth and Oropharynx as per pre-operative assessment

## 2021-07-16 NOTE — Anesthesia Preprocedure Evaluation (Addendum)
Anesthesia Evaluation  Patient identified by MRN, date of birth, ID band Patient awake    Reviewed: Allergy & Precautions, NPO status , Patient's Chart, lab work & pertinent test results  Airway Mallampati: I  TM Distance: >3 FB Neck ROM: Full    Dental no notable dental hx. (+) Teeth Intact, Dental Advisory Given   Pulmonary neg pulmonary ROS, former smoker,    Pulmonary exam normal breath sounds clear to auscultation       Cardiovascular negative cardio ROS Normal cardiovascular exam Rhythm:Regular Rate:Normal     Neuro/Psych negative neurological ROS  negative psych ROS   GI/Hepatic Neg liver ROS, Symptomatic cholelithiasis   Endo/Other  hypercholesterolemia  Renal/GU negative Renal ROS  negative genitourinary   Musculoskeletal negative musculoskeletal ROS (+)   Abdominal   Peds  Hematology negative hematology ROS (+)   Anesthesia Other Findings   Reproductive/Obstetrics negative OB ROS                            Anesthesia Physical  Anesthesia Plan  ASA: 2  Anesthesia Plan: General   Post-op Pain Management:    Induction: Intravenous  PONV Risk Score and Plan: 3 and Treatment may vary due to age or medical condition, Midazolam, Ondansetron and Dexamethasone  Airway Management Planned: Oral ETT  Additional Equipment: None  Intra-op Plan:   Post-operative Plan: Extubation in OR  Informed Consent: I have reviewed the patients History and Physical, chart, labs and discussed the procedure including the risks, benefits and alternatives for the proposed anesthesia with the patient or authorized representative who has indicated his/her understanding and acceptance.     Dental advisory given  Plan Discussed with: CRNA and Anesthesiologist  Anesthesia Plan Comments:        Anesthesia Quick Evaluation

## 2021-07-16 NOTE — Op Note (Signed)
[facility] Patient Name: Amber Davies Procedure Date: 07/16/2021 MRN: [id number] Attending MD: Milus Banister , MD Date of Birth: [birth year] CSN: [id number] Age: 50 Admit Type: Outpatient Procedure:                ERCP Indications:              Elevated liver enzymes, abnormal IOC, abnormal MRCP Providers:                Milus Banister, MD, Elmer Ramp. Tilden Dome, RN, Dietitian, [employer]chant [employer] officer Referring MD:             [name], MD Medicines:                General Anesthesia, Indomethacin 100 mg PR, Cipro                            481 mg IV Complications:            No immediate complications. Estimated blood loss:                            None Estimated Blood Loss:     Estimated blood loss: none. Procedure:                Pre-Anesthesia Assessment:                           - Prior to the procedure, a History and Physical                            was performed, and patient medications and                            allergies were reviewed. The patient's tolerance of                            previous anesthesia was also reviewed. The risks                            and benefits of the procedure and the sedation                            options and risks were discussed with the patient.                            All questions were answered, and informed consent                            was obtained. Prior Anticoagulants: The patient has                            taken no previous anticoagulant or antiplatelet  agents. ASA Grade Assessment: II - A patient with                            mild systemic disease. After reviewing the risks                            and benefits, the patient was deemed in                            satisfactory condition to undergo the procedure.                           After obtaining informed consent, the scope was                            passed under  direct vision. Throughout the                            procedure, the patient's blood pressure, pulse, and                            oxygen saturations were monitored continuously. The                            TJF-Q190V ([id number]) Olympus duodenoscope was                            introduced through the mouth, and used to inject                            contrast into and used to inject contrast into the                            bile duct. The ERCP was accomplished without                            difficulty. The patient tolerated the procedure                            well. Scope In: Scope Out: Findings:      A scout film of the abdomen was obtained. Surgical clips, consistent       with a previous cholecystectomy, were seen in the area of the right       upper quadrant of the abdomen. The duodenoscope was advanced to the       region of the major papilla without detailed examination of the UGI       tract. The major papilla was normal. I used a 44 Autotome over a .035       hydrawire to cannulate the bile duct and injected contrast.       Cholangiogram showed non-dilated extrahepatic biliary tree containing       several small mobile filling defects (stones vs. air bubbles). No       strictures noted. The cystic duct remnant partially opacified. No  obvious biliary leak. I performed a short biliary sphincterotomy over       the wire and then swept the bile duct several times with a 40mm retrieval       balloon, delivering several small air bubbles and a miniscule amount of       sludge/biodebris. Completion, occlusion cholangiogram was normal. The       main pancreatic duct was cannulated a single time with the wire but       never injected with dye. Impression:               - Non-dilated extrahepatic biliary tree.                           - Surgical clips in RUQ.                           - No obvious bile leak.                           - Small air bubbles and a  miniscule amount of                            sludge/biodebris was delivered into the duodenum by                            balloon sweep following small biliary sphincterotomy Moderate Sedation:      Not Applicable - Patient had care per Anesthesia. Recommendation:           - Discharge patient to home.                           - My office will arrange repeat LFTs in 6 weeks. Procedure Code(s):        --- Professional ---                           [phone], Endoscopic retrograde                            cholangiopancreatography (ERCP); with                            sphincterotomy/papillotomy Diagnosis Code(s):        --- Professional ---                           R74.8, Abnormal levels of other serum enzymes CPT copyright 2019 American Medical Association. All rights reserved. The codes documented in this report are preliminary and upon coder review may  be revised to meet current compliance requirements. Milus Banister, MD 07/16/2021 10:26:48 AM This report has been signed electronically. Number of Addenda: 0

## 2021-07-21 DIAGNOSIS — T8332XD Displacement of intrauterine contraceptive device, subsequent encounter: Secondary | ICD-10-CM | POA: Diagnosis not present

## 2021-07-21 DIAGNOSIS — Z30431 Encounter for routine checking of intrauterine contraceptive device: Secondary | ICD-10-CM | POA: Diagnosis not present

## 2021-07-21 DIAGNOSIS — Z6827 Body mass index (BMI) 27.0-27.9, adult: Secondary | ICD-10-CM | POA: Diagnosis not present

## 2021-08-11 ENCOUNTER — Telehealth: Payer: Self-pay | Admitting: Gastroenterology

## 2021-08-11 NOTE — Telephone Encounter (Signed)
Inbound call from patient states she need procedure at [facility] for 1/26 cancelled as she had the procedure in 07/2021. Patient also states she was told need lab done.

## 2021-08-11 NOTE — Telephone Encounter (Signed)
Pt's procedure has been canceled for 08/27/21. Pt is aware & will come in for lab work soon.

## 2021-08-11 NOTE — Telephone Encounter (Signed)
The pt has been advised that the labs have been entered. Amber Davies can come in at her convenience.    Dr Ardis Hughs will this pt need another ERCP on 1/26?  Can we cancel?

## 2021-08-27 ENCOUNTER — Encounter (HOSPITAL_COMMUNITY): Payer: Self-pay

## 2021-08-27 ENCOUNTER — Ambulatory Visit (HOSPITAL_COMMUNITY): Admit: 2021-08-27 | Payer: BC Managed Care – PPO | Admitting: Gastroenterology

## 2021-08-27 SURGERY — ERCP, WITH INTERVENTION IF INDICATED
Anesthesia: General

## 2022-01-01 DIAGNOSIS — Z1231 Encounter for screening mammogram for malignant neoplasm of breast: Secondary | ICD-10-CM | POA: Diagnosis not present

## 2022-01-01 DIAGNOSIS — Z6830 Body mass index (BMI) 30.0-30.9, adult: Secondary | ICD-10-CM | POA: Diagnosis not present

## 2022-01-01 DIAGNOSIS — Z01419 Encounter for gynecological examination (general) (routine) without abnormal findings: Secondary | ICD-10-CM | POA: Diagnosis not present

## 2022-01-20 IMAGING — CT CT ABD-PELV W/ CM
2 of 5 series · 17 of 46 positions shown, 19 images · IV contrast (OMNIPAQUE 350)
Comparison: None.

CLINICAL DATA: Evaluate pancreatitis. Persistent right upper
quadrant and epigastric pain.

EXAM:
CT ABDOMEN AND PELVIS WITH CONTRAST
TECHNIQUE: Multidetector CT imaging of the abdomen and pelvis was performed
using the standard protocol following bolus administration of
intravenous contrast.
CONTRAST:  80mL OMNIPAQUE IOHEXOL 350 MG/ML SOLN

[Series 2: axial st · axial · 0.82mm/px · z∈[-502,-87]mm · 14 of 97 slices shown, 16 images]
[im 7/97  soft-tissue]
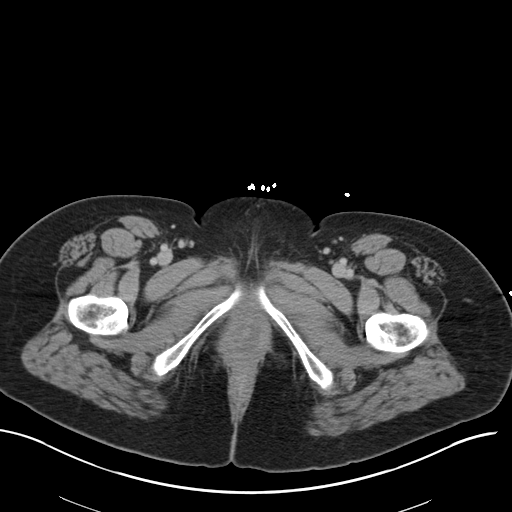
[im 7/97  bone]
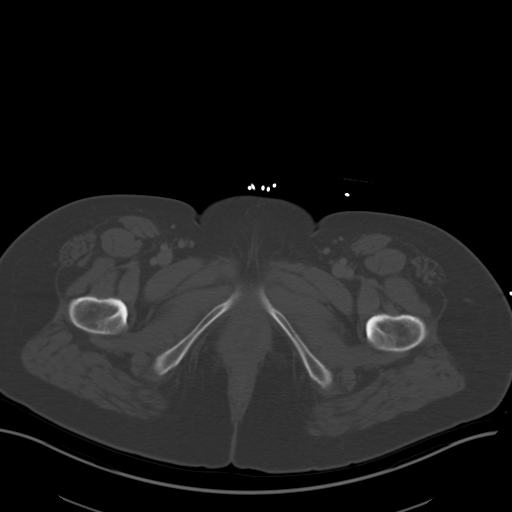
[im 13/97  soft-tissue]
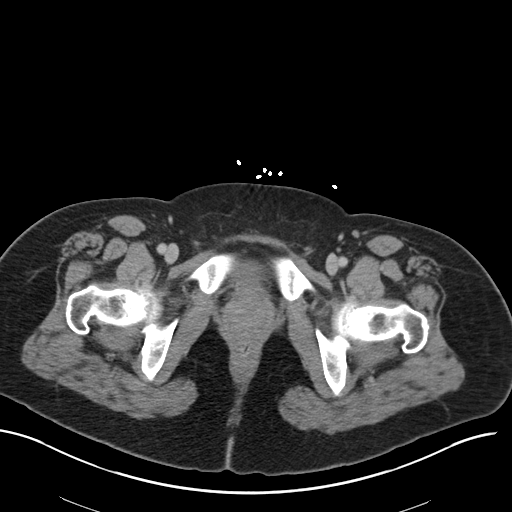
[im 20/97  soft-tissue]
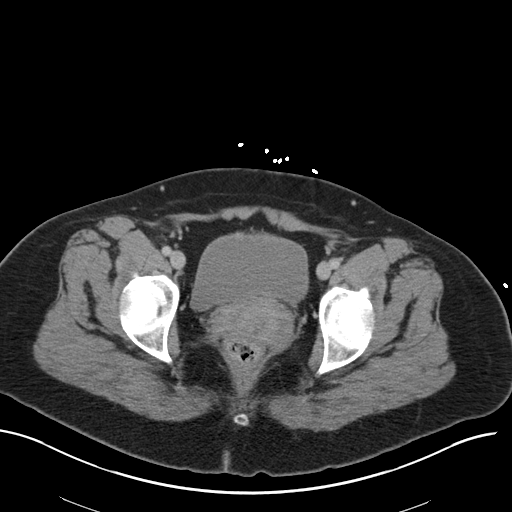
[im 26/97  soft-tissue]
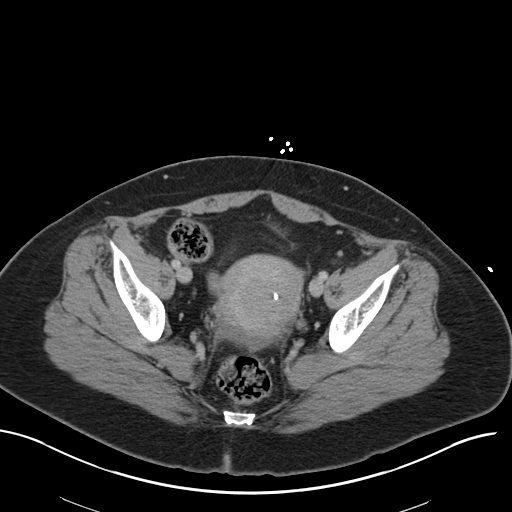
[im 33/97  soft-tissue]
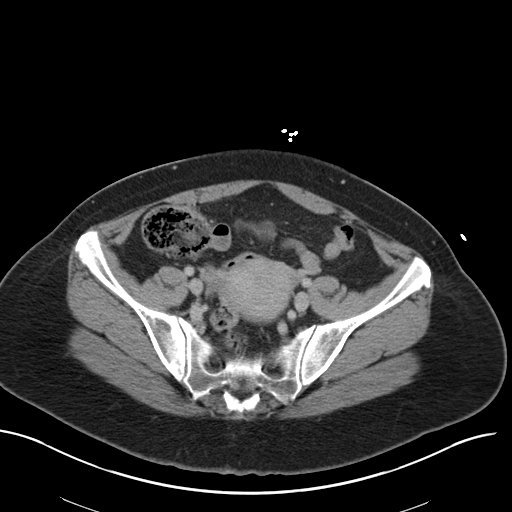
[im 39/97  soft-tissue]
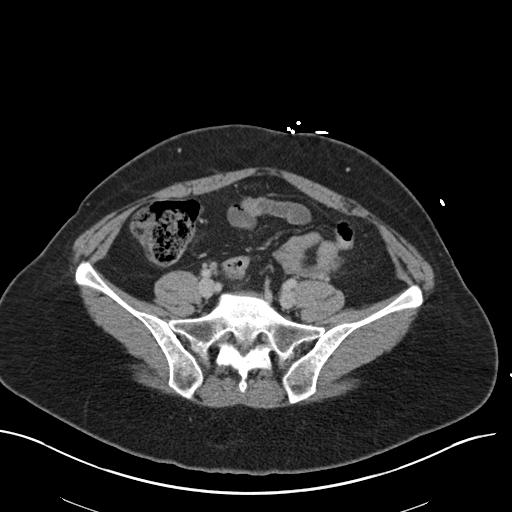
[im 45/97  soft-tissue]
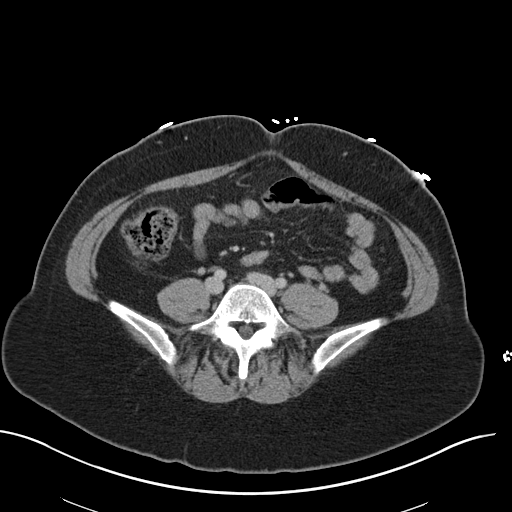
[im 52/97  soft-tissue]
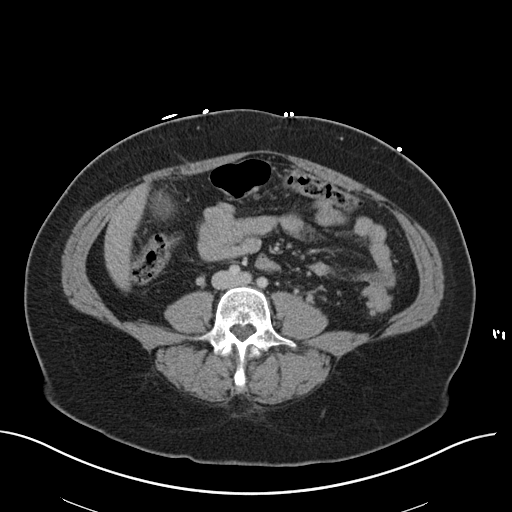
[im 58/97  soft-tissue]
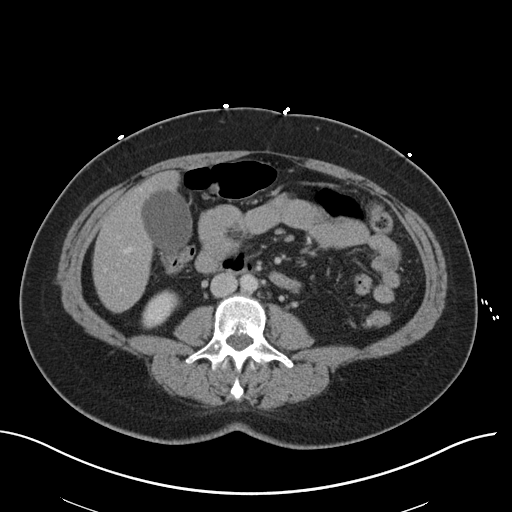
[im 58/97  bone]
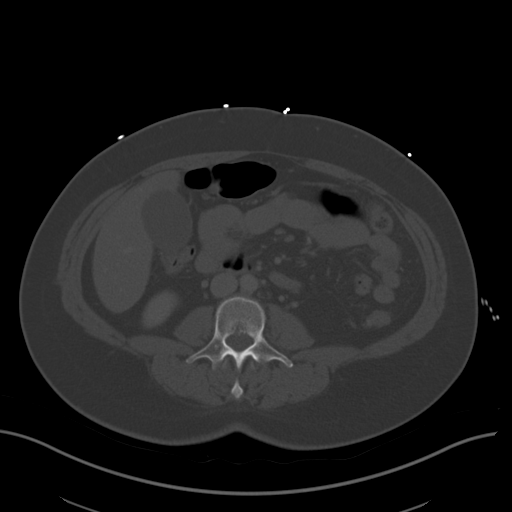
[im 65/97  soft-tissue]
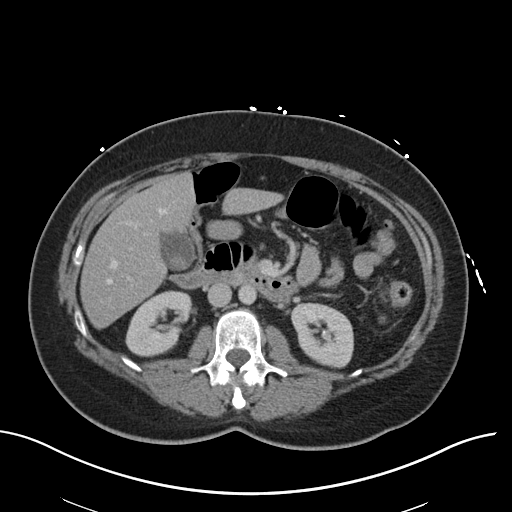
[im 71/97  soft-tissue]
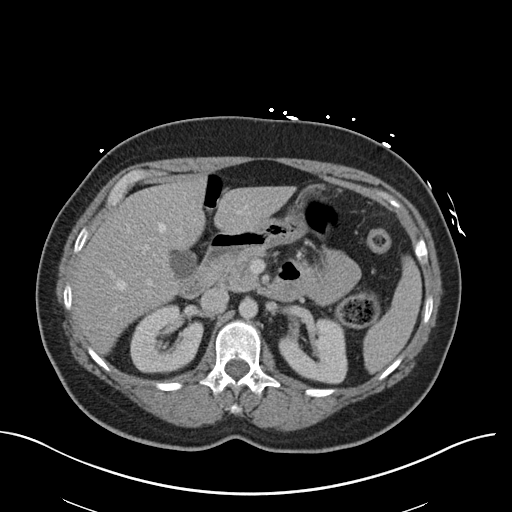
[im 77/97  soft-tissue]
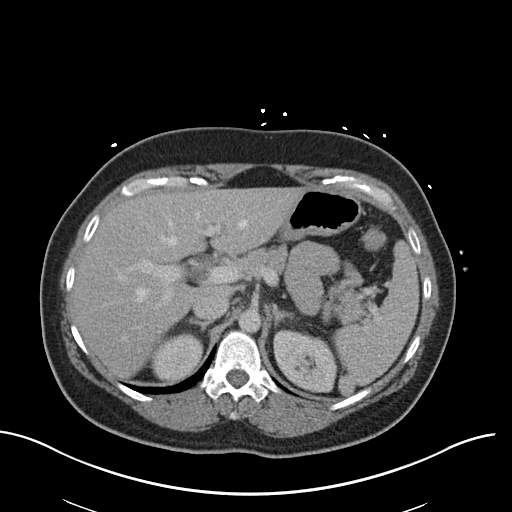
[im 84/97  soft-tissue]
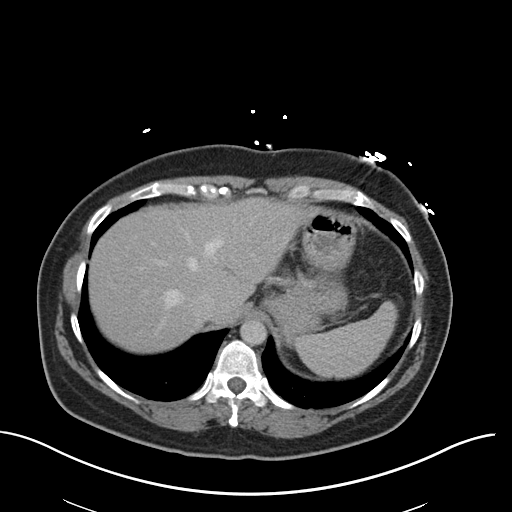
[im 90/97  soft-tissue]
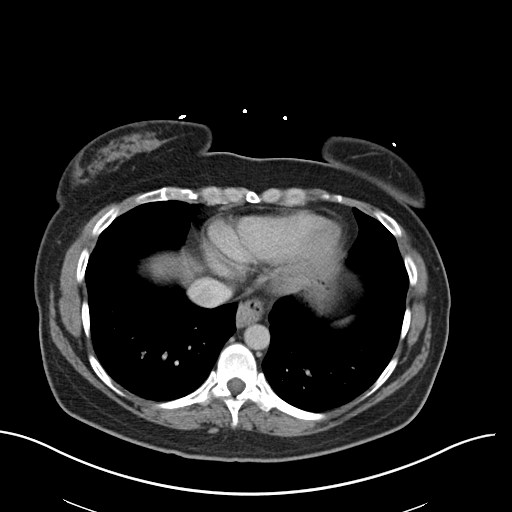

[Series 5: coronal st · coronal · 0.92mm/px · 3 of 149 slices shown]
[im 50/149  soft-tissue]
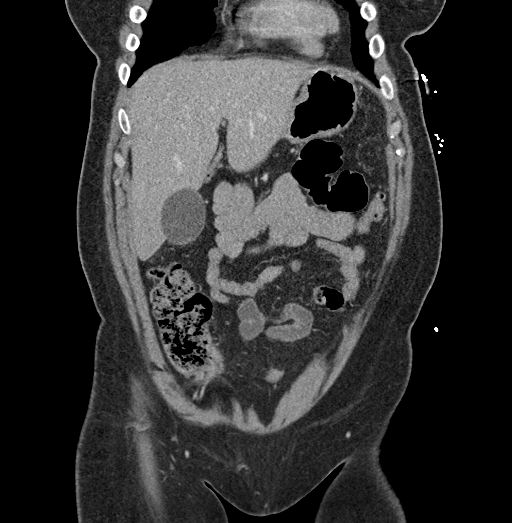
[im 66/149  soft-tissue]
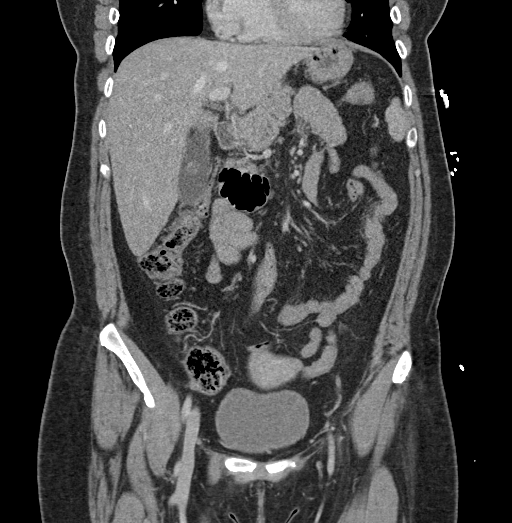
[im 83/149  soft-tissue]
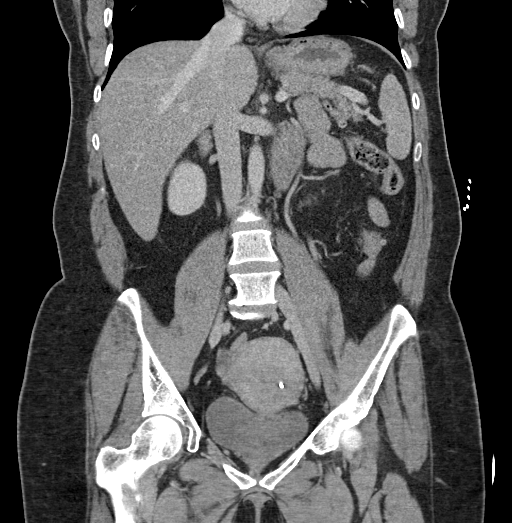

[17 of 46 positions shown; findings below may reference images not displayed]

FINDINGS: Lower chest: No acute abnormality.

Hepatobiliary: No focal liver abnormality. Two stones are identified
within the gallbladder measuring up to 1.7 cm, image [DATE]. No
gallbladder wall thickening or pericholecystic inflammation. No
signs of bile duct dilatation.

Pancreas: Unremarkable. No pancreatic ductal dilatation or
surrounding inflammatory changes.

Spleen: Normal in size without focal abnormality.

Adrenals/Urinary Tract: Normal adrenal glands. No kidney mass or
hydronephrosis identified bilaterally. Urinary bladder is
unremarkable.

Stomach/Bowel: Stomach is within normal limits. Appendix appears
normal. No evidence of bowel wall thickening, distention, or
inflammatory changes. Scattered colonic diverticula without signs of
acute diverticulitis.

Vascular/Lymphatic: Aortic atherosclerosis. No enlarged abdominal or
pelvic lymph nodes.

Reproductive: There is an IUD within the endometrial cavity. Uterus
appears normal. No adnexal mass.

Other: No free fluid or fluid collections.

Musculoskeletal: No acute or significant osseous findings.
Degenerative disc disease identified at L5-S1.
IMPRESSION: 1. No acute findings within the abdomen or pelvis. No findings to
suggest acute pancreatitis.
2. Gallstones. If there is a clinical concern for acute
cholecystitis consider further evaluation with gallbladder sonogram
or nuclear medicine hepatic biliary scan.
3. Aortic Atherosclerosis ([id number]).

## 2022-01-21 DIAGNOSIS — Z1211 Encounter for screening for malignant neoplasm of colon: Secondary | ICD-10-CM | POA: Diagnosis not present

## 2022-01-21 DIAGNOSIS — E785 Hyperlipidemia, unspecified: Secondary | ICD-10-CM | POA: Diagnosis not present

## 2022-02-17 DIAGNOSIS — D259 Leiomyoma of uterus, unspecified: Secondary | ICD-10-CM | POA: Diagnosis not present

## 2022-02-18 IMAGING — RF DG CHOLANGIOGRAM OPERATIVE
1 series · 4 of 4 positions shown · non-contrast
Comparison: Right upper quadrant abdominal ultrasound-05/25/2021

CLINICAL DATA: Intraoperative cholangiogram during laparoscopic
cholecystectomy.

EXAM:
INTRAOPERATIVE CHOLANGIOGRAM
FLUOROSCOPY TIME:  7 seconds (2.2  MGy)

[Series 1: run · 4 of 29 frames shown]
[frame 5/29]
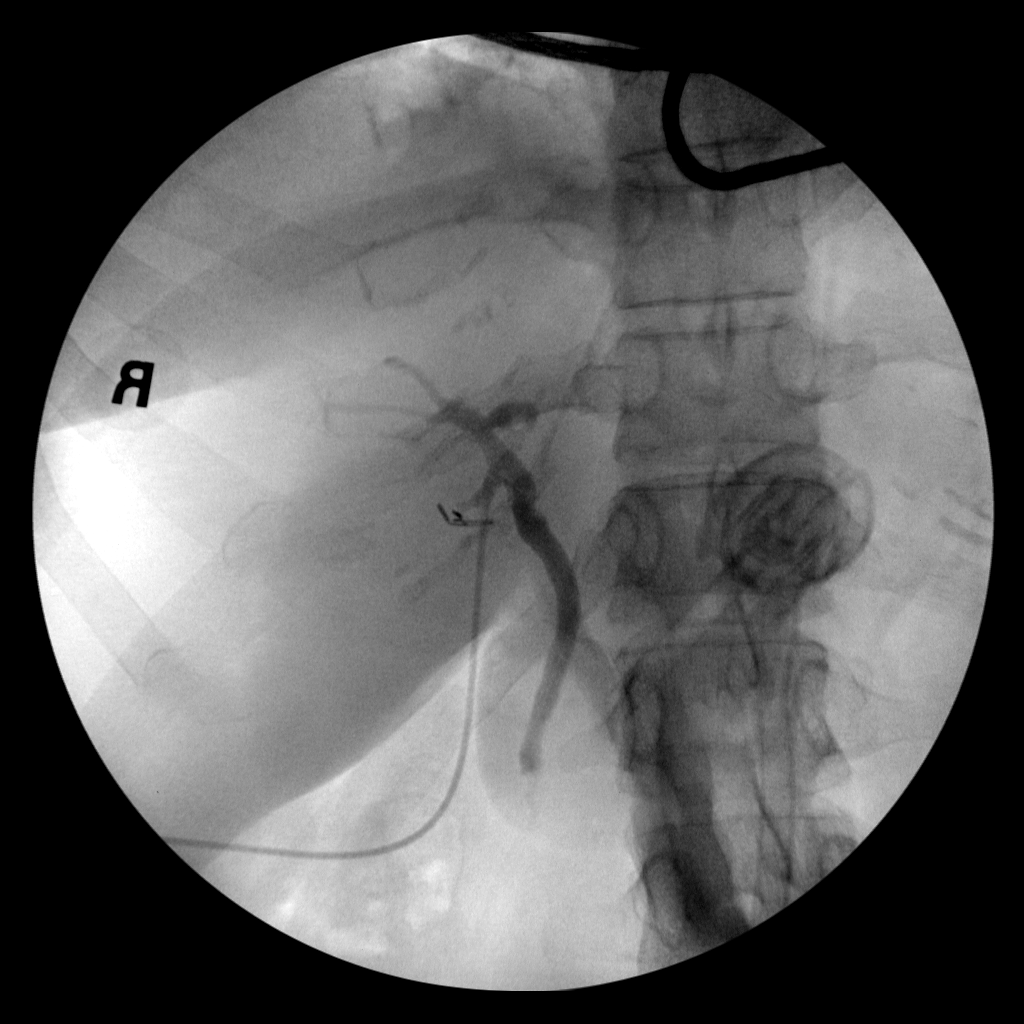
[frame 15/29]
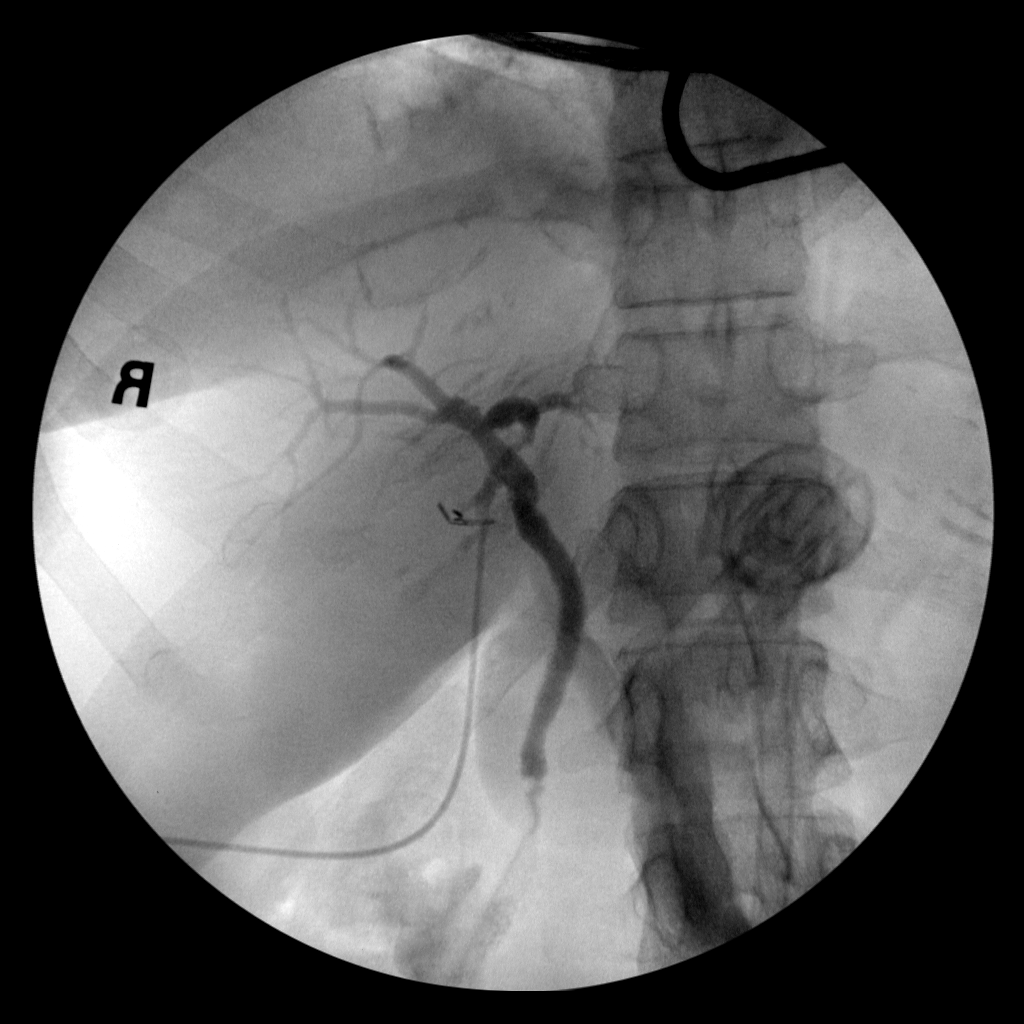
[frame 25/29]
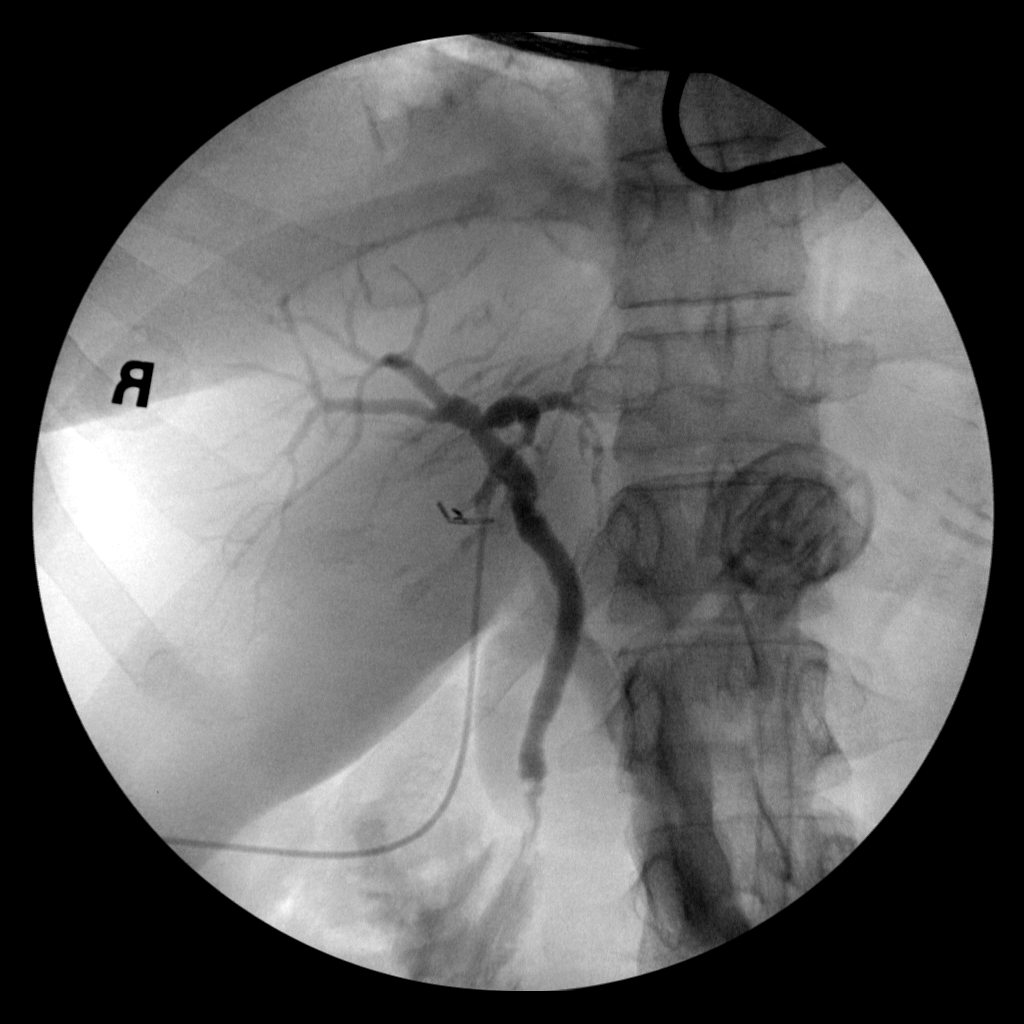
[frame 29/29]
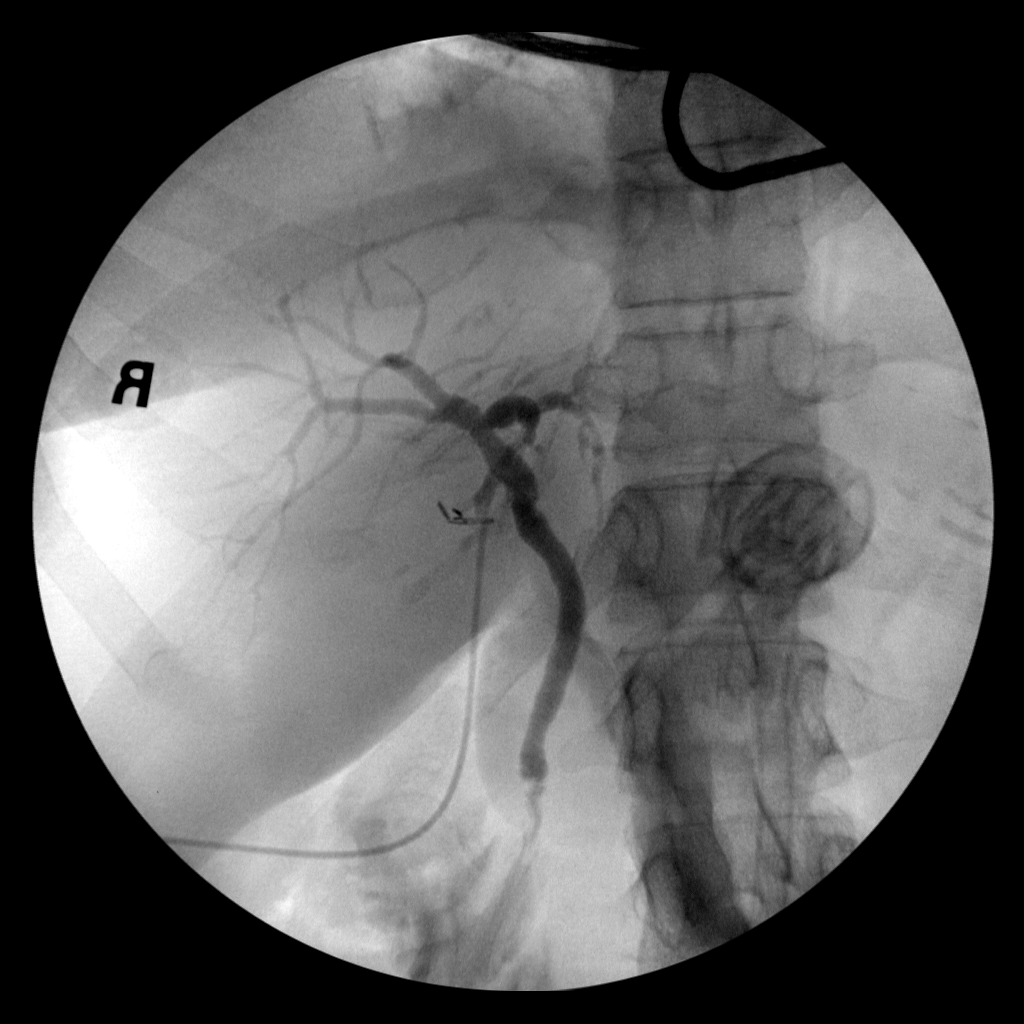

[4 of 4 positions shown; findings below may reference images not displayed]

FINDINGS: Intraoperative cholangiographic images of the right upper abdominal
quadrant during laparoscopic cholecystectomy are provided for
review.

Surgical clips overlie the expected location of the gallbladder
fossa.

Contrast injection demonstrates selective cannulation of the central
aspect of the cystic duct.

There is passage of contrast through the central aspect of the
cystic duct with filling of a non dilated common bile duct. There is
passage of contrast though the CBD and into the descending portion
of the duodenum.

There is minimal reflux of injected contrast into the common hepatic
duct and central aspect of the non dilated intrahepatic biliary
system.

There is a persistent eccentric nonocclusive filling defect within
the distal aspect of the CBD, potentially associated with a fold of
the ampulla though conceivably nonocclusive choledocholithiasis
could have a similar appearance.
IMPRESSION: Persistent nonocclusive eccentric filling defect within the distal
aspect of the CBD, potentially associated with the ampulla, though
conceivably nonocclusive choledocholithiasis could have a similar
appearance. Correlation with the operative report is advised.
Further evaluation and management with ERCP could be performed
indicated.

These results will be called to the ordering clinician or
representative by the Radiologist Assistant, and communication
documented in the PACS or [REDACTED].

## 2022-03-01 ENCOUNTER — Other Ambulatory Visit: Payer: Self-pay | Admitting: Obstetrics and Gynecology

## 2022-03-01 DIAGNOSIS — Z01818 Encounter for other preprocedural examination: Secondary | ICD-10-CM

## 2022-03-13 IMAGING — RF DG ERCP WO/W SPHINCTEROTOMY
1 series · 9 of 9 positions shown · non-contrast
Comparison: MRI abdomen 07/03/2021

CLINICAL DATA: ERCP with sphincterotomy and balloon sweep

EXAM:
ERCP
TECHNIQUE: Multiple spot images obtained with the fluoroscopic device and
submitted for interpretation post-procedure.
FLUOROSCOPY TIME:  Fluoroscopy Time:  3 minutes and 3 seconds
Radiation Exposure Index (if provided by the fluoroscopic device):
Not provided
Number of Acquired Spot Images: 5

[Series 1: run · 6 acquisitions, 9 frames shown]
[im 1/6]
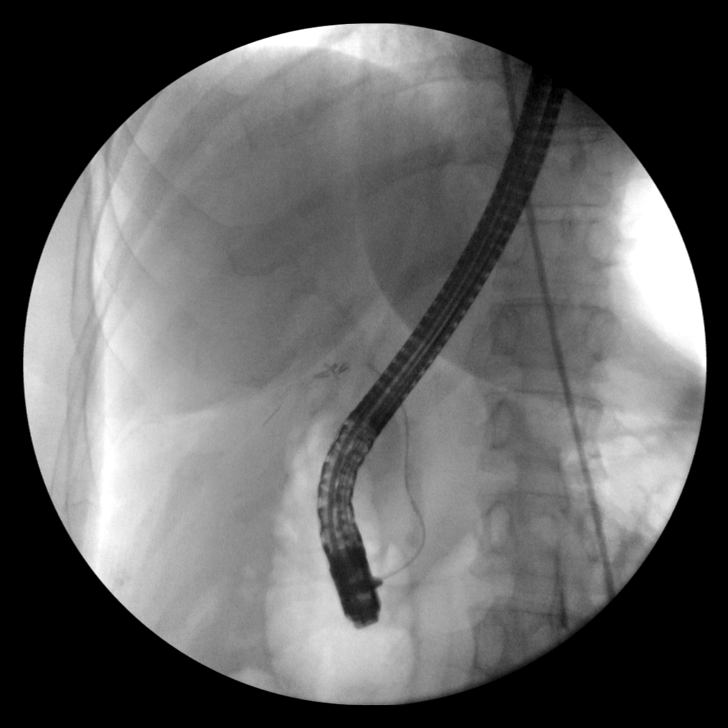
[im 1/6]
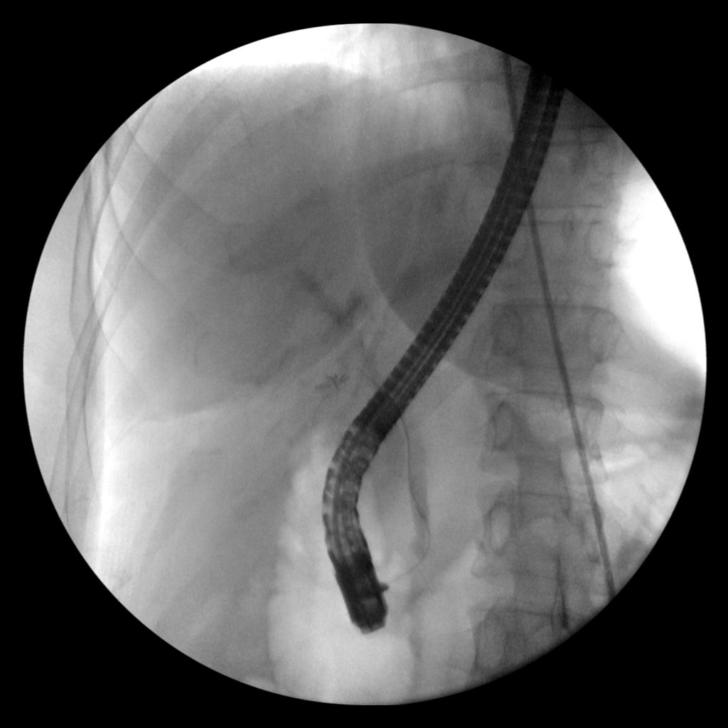
[im 1/6]
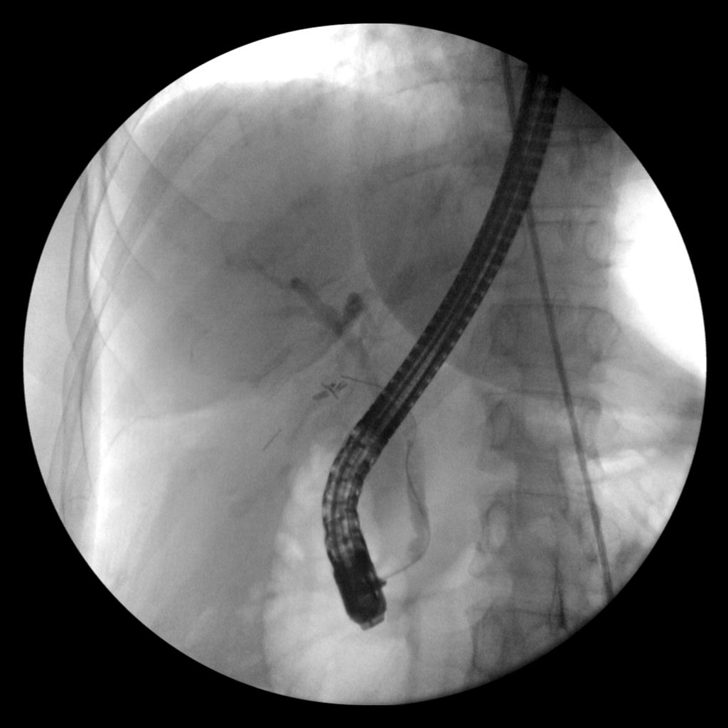
[im 1/6]
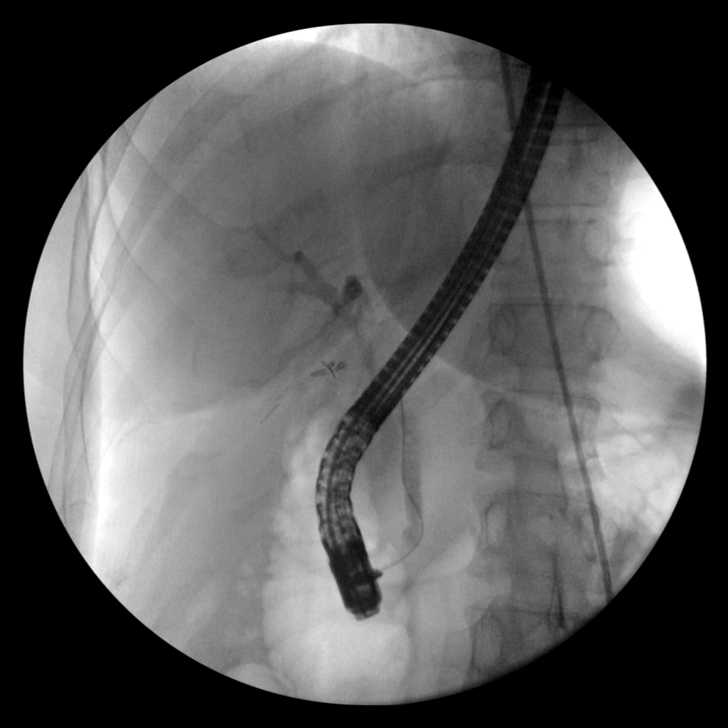
[im 2/6]
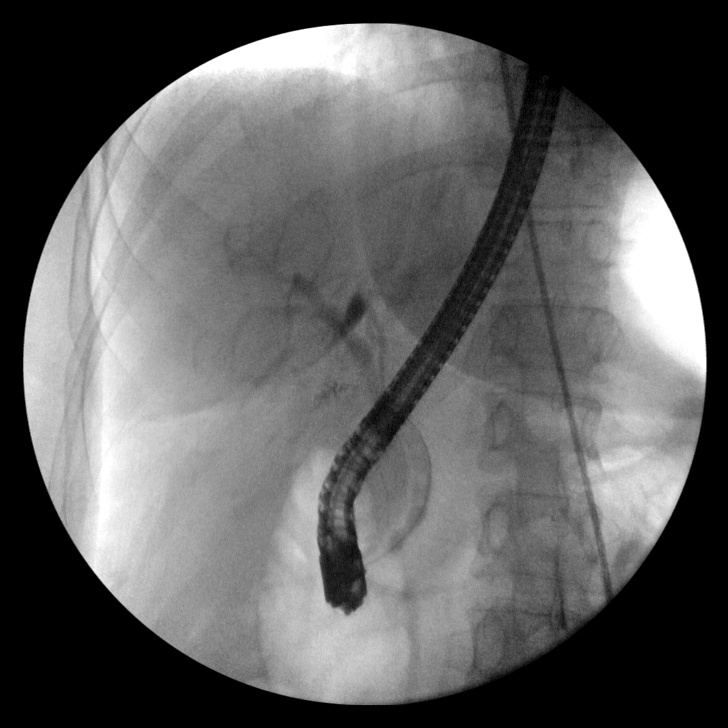
[im 3/6]
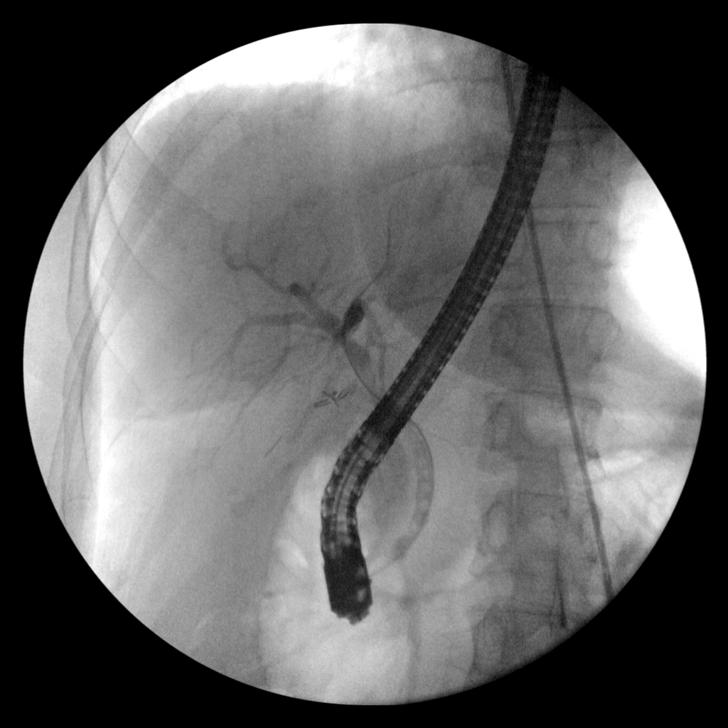
[im 4/6]
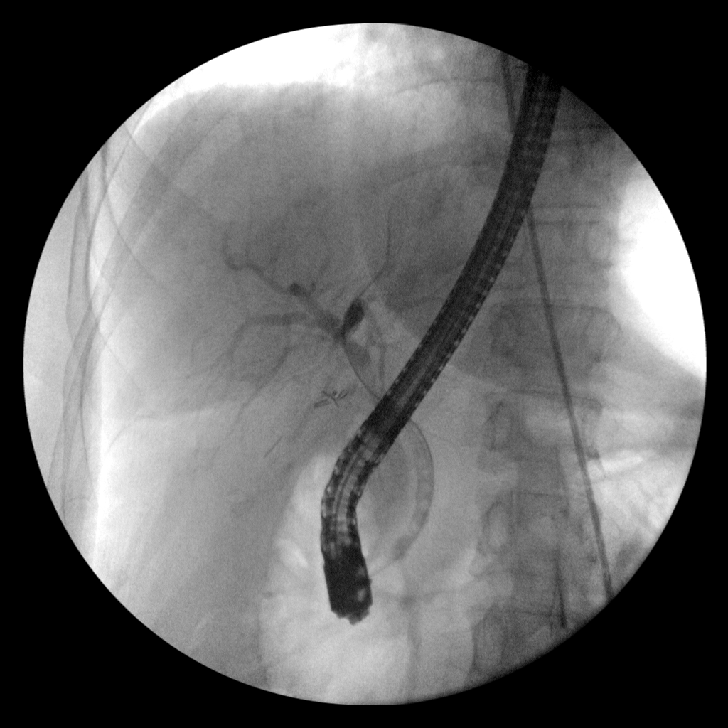
[im 5/6]
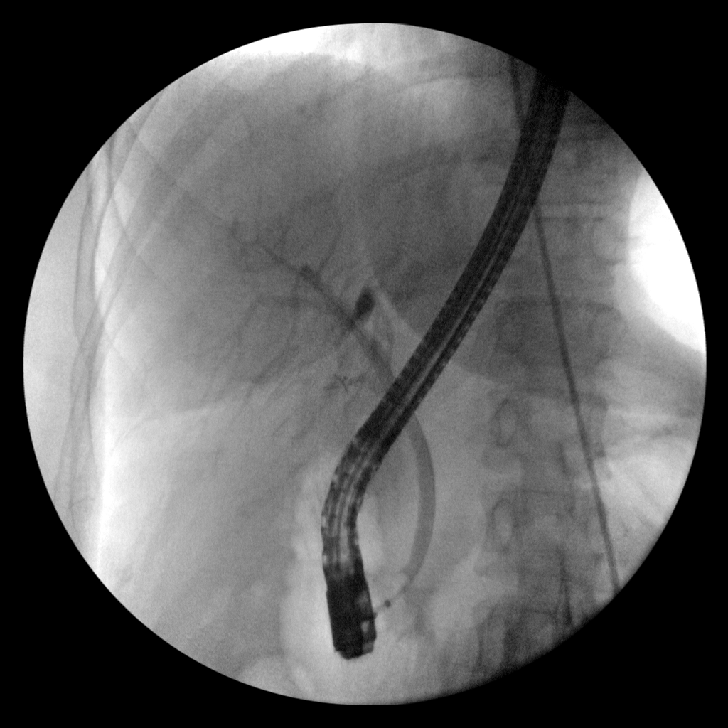
[im 6/6]
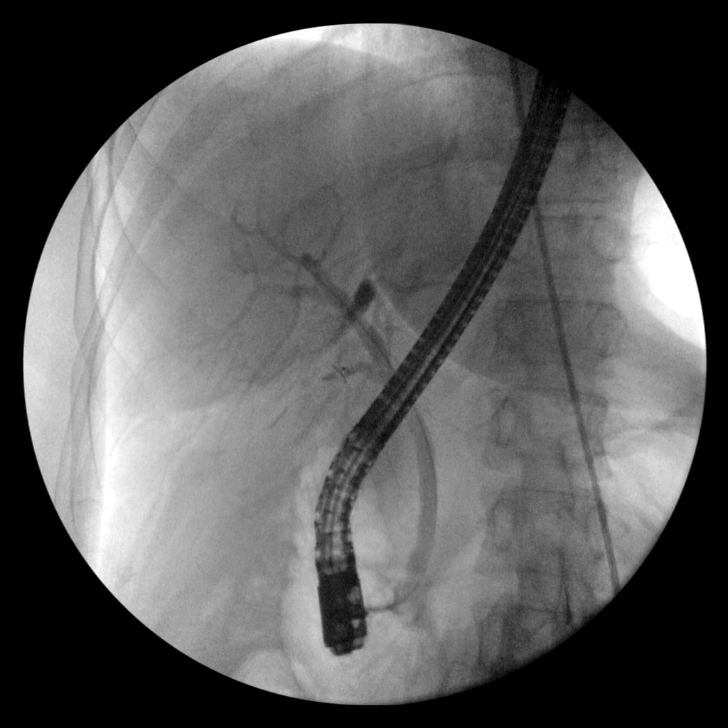

[9 of 9 positions shown; findings below may reference images not displayed]

FINDINGS: Submitted images demonstrate cannulation of the common bile duct
with opacification of intra extrahepatic biliary tree. Multiple
filling defects identified within the biliary tree on the 2 initial
images which are not seen after balloon inflation.

Previously seen eccentric filling defect at the level of the ampulla
is less apparent on today's examination.
IMPRESSION: Intraoperative images of ERCP as above.

These images were submitted for radiologic interpretation only.
Please see the procedural report for the amount of contrast and the
fluoroscopy time utilized.

## 2022-03-17 DIAGNOSIS — D123 Benign neoplasm of transverse colon: Secondary | ICD-10-CM | POA: Diagnosis not present

## 2022-03-17 DIAGNOSIS — K5730 Diverticulosis of large intestine without perforation or abscess without bleeding: Secondary | ICD-10-CM | POA: Diagnosis not present

## 2022-03-17 DIAGNOSIS — K635 Polyp of colon: Secondary | ICD-10-CM | POA: Diagnosis not present

## 2022-03-17 DIAGNOSIS — K648 Other hemorrhoids: Secondary | ICD-10-CM | POA: Diagnosis not present

## 2022-03-17 DIAGNOSIS — Z1211 Encounter for screening for malignant neoplasm of colon: Secondary | ICD-10-CM | POA: Diagnosis not present

## 2022-03-17 DIAGNOSIS — D122 Benign neoplasm of ascending colon: Secondary | ICD-10-CM | POA: Diagnosis not present

## 2022-03-25 ENCOUNTER — Encounter (HOSPITAL_BASED_OUTPATIENT_CLINIC_OR_DEPARTMENT_OTHER): Payer: Self-pay | Admitting: Obstetrics and Gynecology

## 2022-03-31 ENCOUNTER — Encounter (HOSPITAL_BASED_OUTPATIENT_CLINIC_OR_DEPARTMENT_OTHER): Payer: Self-pay | Admitting: Obstetrics and Gynecology

## 2022-03-31 NOTE — Progress Notes (Signed)
Spoke w/ via phone for pre-op interview--- pt Lab needs dos----  CBCdiff, T&S, urine preg             Lab results------ no COVID test -----patient states asymptomatic no test needed Arrive at ------- 0630 on 04-14-2022 NPO after MN NO Solid Food.  Clear liquids from MN until--- 0530 Med rec completed Medications to take morning of surgery ----- hailey, claritin Diabetic medication ----- n/a Patient instructed no nail polish to be worn day of surgery Patient instructed to bring photo id and insurance card day of surgery Patient aware to have Driver (ride ) / caregiver for 24 hours after surgery --- husband, chuck Patient Special Instructions ----- n/a Pre-Op special Istructions ----- n/a Patient verbalized understanding of instructions that were given at this phone interview. Patient denies shortness of breath, chest pain, fever, cough at this phone interview.

## 2022-04-13 NOTE — H&P (Signed)
51 y.o. with retained IUD and intracavitary fibroid blocking removal.  Previously:  "Still bleeding despite OCPs and would rather stop bleeding than worry about se of OCPs. Attempted to remove IUD, unable to do. Will do Endosee with paracervical block and Ibuprofen 800 mg before."  " No additional procedure was performed. The strings to the IUD were seen behind the fibroid but unable to be reached with the instrument. All instruments were removed. Hemostasis was achieved at the tenaculum site."   Past Medical History:  Diagnosis Date   Menorrhagia    Uterine leiomyoma    intracavity   Wears glasses    Past Surgical History:  Procedure Laterality Date   CHOLECYSTECTOMY N/A 06/23/2021   Procedure: LAPAROSCOPIC CHOLECYSTECTOMY;  Surgeon: [name], MD;  Location: [facility];  Service: General;  Laterality: N/A;   ENDOSCOPIC RETROGRADE CHOLANGIOPANCREATOGRAPHY (ERCP) WITH PROPOFOL N/A 07/16/2021   Procedure: ENDOSCOPIC RETROGRADE CHOLANGIOPANCREATOGRAPHY (ERCP) WITH PROPOFOL;  Surgeon: Milus Banister, MD;  Location: [facility];  Service: Endoscopy;  Laterality: N/A;   INTRAOPERATIVE CHOLANGIOGRAM N/A 06/23/2021   Procedure: INTRAOPERATIVE CHOLANGIOGRAM;  Surgeon: [name], MD;  Location: [facility];  Service: General;  Laterality: N/A;   SPHINCTEROTOMY  07/16/2021   Procedure: Joan Mayans;  Surgeon: Milus Banister, MD;  Location: [facility];  Service: Endoscopy;;  balloon sweep no stones seen   WISDOM TOOTH EXTRACTION      Social History   Socioeconomic History   Marital status: Married    Spouse name: Not on file   Number of children: 2   Years of education: Not on file   Highest education level: Not on file  Occupational History   Occupation: Lexicographer  Tobacco Use   Smoking status: Former    Years: 15.00    Types: Cigarettes    Quit date: 2005    Years since quitting: 18.7   Smokeless tobacco: Never  Vaping Use   Vaping Use: Never used  Substance and Sexual  Activity   Alcohol use: Yes    Comment: occasional   Drug use: Not Currently    Comment: per pt last used 2021   Sexual activity: Yes    Birth control/protection: I.U.D.  Other Topics Concern   Not on file  Social History Narrative   Not on file   Social Determinants of Health   Financial Resource Strain: Not on file  Food Insecurity: Not on file  Transportation Needs: Not on file  Physical Activity: Not on file  Stress: Not on file  Social Connections: Not on file  Intimate Partner Violence: Not on file    No current facility-administered medications on file prior to encounter.   Current Outpatient Medications on File Prior to Encounter  Medication Sig Dispense Refill   acetaminophen (TYLENOL) 500 MG tablet Take 500 mg by mouth every 8 (eight) hours as needed (pain.).     Ascorbic Acid (VITAMIN C PO) Take by mouth daily. Per pt takes 3 gummies daily     ibuprofen (ADVIL) 200 MG tablet Take 400 mg by mouth every 8 (eight) hours as needed (for pain.).     loratadine (CLARITIN) 10 MG tablet Take 10 mg by mouth daily.     melatonin 5 MG TABS Take 5 mg by mouth at bedtime as needed (sleep).     Norethindrone Acetate-Ethinyl Estradiol (HAILEY 1.5/30) 1.5-30 MG-MCG tablet Take 1 tablet by mouth daily.     levonorgestrel (MIRENA) 20 MCG/DAY IUD 1 each by Intrauterine route once.  No Known Allergies  Vitals:   03/31/22 1553  Weight: 82.6 kg  Height: '5\' 6"'$  (1.676 m)    Lungs: clear to ascultation Cor:  RRR Abdomen:  soft, nontender, nondistended. Ex:  no cords, erythema Pelvic:   Vulva: no masses, no atrophy, no lesions Vagina: no tenderness, no erythema, no abnormal vaginal discharge, no vesicle(s) or ulcers, no cystocele, no rectocele Cervix: grossly normal, no discharge, no cervical motion tenderness, sample taken for a Pap smear Uterus: normal size (7, strings NOT seen or felt), normal shape, midline, no uterine prolapse, non-tender Bladder/Urethra: normal meatus,  no urethral discharge, no urethral mass, bladder non distended, Urethra well supported Adnexa/Parametria: no parametrial tenderness, no parametrial mass, no adnexal tenderness, no ovarian mass   A:  Pt. here for pre op visit; D&C hysteroscopy, Myosure, IUD removal scheduled 09/13; on OCPs/tb//  P: P: All risks, benefits and alternatives d/w patient and she desires to proceed.  Patient has ERAS protocol and will receive SCDs during the operation.    Daria Pastures

## 2022-04-13 NOTE — Progress Notes (Signed)
Talked with patient. Reinforced time change from 0630 to 0830 clear liquids until 0730

## 2022-04-14 ENCOUNTER — Encounter (HOSPITAL_BASED_OUTPATIENT_CLINIC_OR_DEPARTMENT_OTHER): Payer: Self-pay | Admitting: Obstetrics and Gynecology

## 2022-04-14 ENCOUNTER — Ambulatory Visit (HOSPITAL_BASED_OUTPATIENT_CLINIC_OR_DEPARTMENT_OTHER): Payer: BC Managed Care – PPO | Admitting: Certified Registered Nurse Anesthetist

## 2022-04-14 ENCOUNTER — Other Ambulatory Visit: Payer: Self-pay

## 2022-04-14 ENCOUNTER — Ambulatory Visit (HOSPITAL_BASED_OUTPATIENT_CLINIC_OR_DEPARTMENT_OTHER)
Admission: RE | Admit: 2022-04-14 | Discharge: 2022-04-14 | Disposition: A | Discharge status: Home / Self Care | Payer: BC Managed Care – PPO | Attending: Obstetrics and Gynecology | Admitting: Obstetrics and Gynecology

## 2022-04-14 ENCOUNTER — Encounter (HOSPITAL_BASED_OUTPATIENT_CLINIC_OR_DEPARTMENT_OTHER)
Admission: RE | Disposition: A | Discharge status: Home / Self Care | Payer: Self-pay | Source: Home / Self Care | Attending: Obstetrics and Gynecology

## 2022-04-14 DIAGNOSIS — Z01818 Encounter for other preprocedural examination: Secondary | ICD-10-CM

## 2022-04-14 DIAGNOSIS — Z975 Presence of (intrauterine) contraceptive device: Secondary | ICD-10-CM | POA: Insufficient documentation

## 2022-04-14 DIAGNOSIS — Z9889 Other specified postprocedural states: Secondary | ICD-10-CM | POA: Diagnosis not present

## 2022-04-14 DIAGNOSIS — Z87891 Personal history of nicotine dependence: Secondary | ICD-10-CM | POA: Insufficient documentation

## 2022-04-14 DIAGNOSIS — Z30432 Encounter for removal of intrauterine contraceptive device: Secondary | ICD-10-CM | POA: Diagnosis not present

## 2022-04-14 DIAGNOSIS — Z793 Long term (current) use of hormonal contraceptives: Secondary | ICD-10-CM | POA: Diagnosis not present

## 2022-04-14 DIAGNOSIS — D259 Leiomyoma of uterus, unspecified: Secondary | ICD-10-CM | POA: Diagnosis not present

## 2022-04-14 HISTORY — DX: Leiomyoma of uterus, unspecified: D25.9

## 2022-04-14 HISTORY — PX: DILATATION & CURETTAGE/HYSTEROSCOPY WITH MYOSURE: SHX6511

## 2022-04-14 HISTORY — PX: IUD REMOVAL: SHX5392

## 2022-04-14 HISTORY — DX: Excessive and frequent menstruation with regular cycle: N92.0

## 2022-04-14 HISTORY — DX: Presence of spectacles and contact lenses: Z97.3

## 2022-04-14 LAB — ABO/RH: ABO/RH(D): A POS

## 2022-04-14 LAB — COMPREHENSIVE METABOLIC PANEL
ALT: 22 U/L (ref 0–44)
AST: 30 U/L (ref 15–41)
Albumin: 3.9 g/dL (ref 3.5–5.0)
Alkaline Phosphatase: 47 U/L (ref 38–126)
Anion gap: 8 (ref 5–15)
BUN: 10 mg/dL (ref 6–20)
CO2: 19 mmol/L — ABNORMAL LOW (ref 22–32)
Calcium: 8.9 mg/dL (ref 8.9–10.3)
Chloride: 112 mmol/L — ABNORMAL HIGH (ref 98–111)
Creatinine, Ser: 0.87 mg/dL (ref 0.44–1.00)
GFR, Estimated: >60 mL/min (ref >60)
Glucose, Bld: 243 mg/dL — ABNORMAL HIGH (ref 70–99)
Potassium: 4.1 mmol/L (ref 3.5–5.1)
Sodium: 139 mmol/L (ref 135–145)
Total Bilirubin: 0.5 mg/dL (ref 0.3–1.2)
Total Protein: 6.6 g/dL (ref 6.5–8.1)

## 2022-04-14 LAB — CBC WITH DIFFERENTIAL/PLATELET
Abs Immature Granulocytes: 0.01 10*3/uL (ref 0.00–0.07)
Basophils Absolute: 0 10*3/uL (ref 0.0–0.1)
Basophils Relative: 1 %
Eosinophils Absolute: 0.1 10*3/uL (ref 0.0–0.5)
Eosinophils Relative: 2 %
HCT: 44.4 % (ref 36.0–46.0)
Hemoglobin: 15.4 g/dL — ABNORMAL HIGH (ref 12.0–15.0)
Immature Granulocytes: 0 %
Lymphocytes Relative: 30 %
Lymphs Abs: 1.2 10*3/uL (ref 0.7–4.0)
MCH: 32.6 pg (ref 26.0–34.0)
MCHC: 34.7 g/dL (ref 30.0–36.0)
MCV: 94.1 fL (ref 80.0–100.0)
Monocytes Absolute: 0.2 10*3/uL (ref 0.1–1.0)
Monocytes Relative: 6 %
Neutro Abs: 2.4 10*3/uL (ref 1.7–7.7)
Neutrophils Relative %: 61 %
Platelets: 276 10*3/uL (ref 150–400)
RBC: 4.72 MIL/uL (ref 3.87–5.11)
RDW: 12.1 % (ref 11.5–15.5)
WBC: 3.9 10*3/uL — ABNORMAL LOW (ref 4.0–10.5)
nRBC: 0 % (ref 0.0–0.2)

## 2022-04-14 LAB — TYPE AND SCREEN
ABO/RH(D): A POS
Antibody Screen: NEGATIVE

## 2022-04-14 LAB — POCT PREGNANCY, URINE: Preg Test, Ur: NEGATIVE

## 2022-04-14 SURGERY — REMOVAL, INTRAUTERINE DEVICE
Anesthesia: General

## 2022-04-14 MED ORDER — PROPOFOL 10 MG/ML IV BOLUS
INTRAVENOUS | Status: DC | PRN
  Administered 2022-04-14: 200 mg via INTRAVENOUS

## 2022-04-14 MED ORDER — METHYLERGONOVINE MALEATE 0.2 MG/ML IJ SOLN
INTRAMUSCULAR | Status: DC | PRN
  Administered 2022-04-14: .2 mg via INTRAMUSCULAR

## 2022-04-14 MED ORDER — POVIDONE-IODINE 10 % EX SWAB: 2.0000 | Freq: Once | CUTANEOUS | Status: DC

## 2022-04-14 MED ORDER — ONDANSETRON HCL 4 MG/2ML IJ SOLN: 4.0000 mg | Freq: Four times a day (QID) | INTRAMUSCULAR | Status: DC | PRN

## 2022-04-14 MED ORDER — SODIUM CHLORIDE 0.9 % IR SOLN
Status: DC | PRN
  Administered 2022-04-14 (×6): 3000 mL

## 2022-04-14 MED ORDER — FENTANYL CITRATE (PF) 250 MCG/5ML IJ SOLN
INTRAMUSCULAR | Status: DC | PRN
  Administered 2022-04-14 (×2): 25 ug via INTRAVENOUS
  Administered 2022-04-14: 50 ug via INTRAVENOUS

## 2022-04-14 MED ORDER — NORETHINDRONE ACET-ETHINYL EST 1.5-30 MG-MCG PO TABS: 1.0000 | ORAL_TABLET | Freq: Every day | ORAL | Status: DC

## 2022-04-14 MED ORDER — LORATADINE 10 MG PO TABS
ORAL_TABLET | ORAL | Status: AC
  Filled 2022-04-14: qty 1

## 2022-04-14 MED ORDER — MIDAZOLAM HCL 2 MG/2ML IJ SOLN
INTRAMUSCULAR | Status: DC | PRN
  Administered 2022-04-14: 2 mg via INTRAVENOUS

## 2022-04-14 MED ORDER — IBUPROFEN 800 MG PO TABS: 800.0000 mg | ORAL_TABLET | Freq: Three times a day (TID) | ORAL | 0 refills | Status: AC

## 2022-04-14 MED ORDER — MIDAZOLAM HCL 2 MG/2ML IJ SOLN
INTRAMUSCULAR | Status: AC
  Filled 2022-04-14: qty 2

## 2022-04-14 MED ORDER — ACETAMINOPHEN 500 MG PO TABS: 500.0000 mg | ORAL_TABLET | Freq: Three times a day (TID) | ORAL | Status: DC | PRN

## 2022-04-14 MED ORDER — OXYCODONE HCL 5 MG/5ML PO SOLN: 5.0000 mg | Freq: Once | ORAL | Status: DC | PRN

## 2022-04-14 MED ORDER — IBUPROFEN 800 MG PO TABS
800.0000 mg | ORAL_TABLET | Freq: Three times a day (TID) | ORAL | Status: DC
  Administered 2022-04-14: 800 mg via ORAL

## 2022-04-14 MED ORDER — DEXAMETHASONE SODIUM PHOSPHATE 10 MG/ML IJ SOLN
INTRAMUSCULAR | Status: DC | PRN
  Administered 2022-04-14: 10 mg via INTRAVENOUS

## 2022-04-14 MED ORDER — IBUPROFEN 800 MG PO TABS
ORAL_TABLET | ORAL | Status: AC
  Filled 2022-04-14: qty 1

## 2022-04-14 MED ORDER — LORATADINE 10 MG PO TABS
10.0000 mg | ORAL_TABLET | Freq: Every day | ORAL | Status: DC
  Administered 2022-04-14: 10 mg via ORAL

## 2022-04-14 MED ORDER — TRAMADOL HCL 50 MG PO TABS: 50.0000 mg | ORAL_TABLET | Freq: Four times a day (QID) | ORAL | 0 refills | Status: AC | PRN

## 2022-04-14 MED ORDER — LACTATED RINGERS IV SOLN: INTRAVENOUS | Status: DC

## 2022-04-14 MED ORDER — ONDANSETRON HCL 4 MG PO TABS: 4.0000 mg | ORAL_TABLET | Freq: Four times a day (QID) | ORAL | Status: DC | PRN

## 2022-04-14 MED ORDER — SOD CITRATE-CITRIC ACID 500-334 MG/5ML PO SOLN: 30.0000 mL | ORAL | Status: DC

## 2022-04-14 MED ORDER — FENTANYL CITRATE (PF) 100 MCG/2ML IJ SOLN: 25.0000 ug | INTRAMUSCULAR | Status: DC | PRN

## 2022-04-14 MED ORDER — FENTANYL CITRATE (PF) 100 MCG/2ML IJ SOLN
INTRAMUSCULAR | Status: AC
  Filled 2022-04-14: qty 2

## 2022-04-14 MED ORDER — METHYLERGONOVINE MALEATE 0.2 MG/ML IJ SOLN
INTRAMUSCULAR | Status: AC
  Filled 2022-04-14: qty 1

## 2022-04-14 MED ORDER — OXYCODONE HCL 5 MG PO TABS: 5.0000 mg | ORAL_TABLET | Freq: Once | ORAL | Status: DC | PRN

## 2022-04-14 MED ORDER — ONDANSETRON HCL 4 MG/2ML IJ SOLN
INTRAMUSCULAR | Status: DC | PRN
  Administered 2022-04-14: 4 mg via INTRAVENOUS

## 2022-04-14 MED ORDER — MENTHOL 3 MG MT LOZG: 1.0000 | LOZENGE | OROMUCOSAL | Status: DC | PRN

## 2022-04-14 MED ORDER — LIDOCAINE 2% (20 MG/ML) 5 ML SYRINGE
INTRAMUSCULAR | Status: DC | PRN
  Administered 2022-04-14: 60 mg via INTRAVENOUS

## 2022-04-14 MED ORDER — OXYCODONE HCL 5 MG PO TABS: 5.0000 mg | ORAL_TABLET | ORAL | Status: DC | PRN

## 2022-04-14 MED ORDER — PROPOFOL 10 MG/ML IV BOLUS
INTRAVENOUS | Status: AC
  Filled 2022-04-14: qty 20

## 2022-04-14 SURGICAL SUPPLY — 17 items
CATH ROBINSON RED A/P 16FR (CATHETERS) ×1 IMPLANT
DEVICE MYOSURE LITE (MISCELLANEOUS) IMPLANT
DEVICE MYOSURE REACH (MISCELLANEOUS) IMPLANT
DRSG TELFA 3X8 NADH STRL (GAUZE/BANDAGES/DRESSINGS) ×1 IMPLANT
GAUZE 4X4 16PLY ~~LOC~~+RFID DBL (SPONGE) ×2 IMPLANT
GLOVE BIO SURGEON STRL SZ7 (GLOVE) ×1 IMPLANT
GOWN STRL REUS W/TWL LRG LVL3 (GOWN DISPOSABLE) ×1 IMPLANT
IV NS IRRIG 3000ML ARTHROMATIC (IV SOLUTION) IMPLANT
KIT PROCEDURE FLUENT (KITS) ×1 IMPLANT
KIT TURNOVER CYSTO (KITS) ×1 IMPLANT
MYOSURE XL FIBROID (MISCELLANEOUS)
PACK VAGINAL MINOR WOMEN LF (CUSTOM PROCEDURE TRAY) ×1 IMPLANT
PAD OB MATERNITY 4.3X12.25 (PERSONAL CARE ITEMS) ×1 IMPLANT
SEAL CERVICAL OMNI LOK (ABLATOR) IMPLANT
SEAL ROD LENS SCOPE MYOSURE (ABLATOR) ×1 IMPLANT
SUT VIC AB 2-0 CT2 27 (SUTURE) IMPLANT
SYSTEM TISS REMOVAL MYOSURE XL (MISCELLANEOUS) IMPLANT

## 2022-04-14 NOTE — Discharge Summary (Signed)
Physician Discharge Summary  Patient ID: Amber Davies MRN: [id number] DOB/AGE: 50-Dec-1972 50 y.o.  Admit date: 04/14/2022 Discharge date: 04/14/2022  Admission Diagnoses:retained IUD and intracavitary fibroid  Discharge Diagnoses:  Principal Problem:   Postoperative state   Discharged Condition: good  Hospital Course: uncomplicated myosure of fibroid, hysteroscopy and removal of IUD; fluid deficit was higher than normal secondary length of case and pt kept for longer recovery to check CMP which was normal.  Normal bleeding from vagina.   Consults: None  Significant Diagnostic Studies: labs:  Results for orders placed or performed during the hospital encounter of 04/14/22 (from the past 24 hour(s))  Pregnancy, urine POC     Status: None   Collection Time: 04/14/22  8:51 AM  Result Value Ref Range   Preg Test, Ur NEGATIVE NEGATIVE  Type and screen     Status: None   Collection Time: 04/14/22  9:28 AM  Result Value Ref Range   ABO/RH(D) A POS    Antibody Screen NEG    Sample Expiration      04/17/2022,2359 Performed at [facility], [place] [address], [place], [place] [address]   CBC with Differential/Platelet     Status: Abnormal   Collection Time: 04/14/22  9:28 AM  Result Value Ref Range   WBC 3.9 (L) 4.0 - 10.5 K/uL   RBC 4.72 3.87 - 5.11 MIL/uL   Hemoglobin 15.4 (H) 12.0 - 15.0 g/dL   HCT 44.4 36.0 - 46.0 %   MCV 94.1 80.0 - 100.0 fL   MCH 32.6 26.0 - 34.0 pg   MCHC 34.7 30.0 - 36.0 g/dL   RDW 12.1 11.5 - 15.5 %   Platelets 276 150 - 400 K/uL   nRBC 0.0 0.0 - 0.2 %   Neutrophils Relative % 61 %   Neutro Abs 2.4 1.7 - 7.7 K/uL   Lymphocytes Relative 30 %   Lymphs Abs 1.2 0.7 - 4.0 K/uL   Monocytes Relative 6 %   Monocytes Absolute 0.2 0.1 - 1.0 K/uL   Eosinophils Relative 2 %   Eosinophils Absolute 0.1 0.0 - 0.5 K/uL   Basophils Relative 1 %   Basophils Absolute 0.0 0.0 - 0.1 K/uL   Immature Granulocytes 0 %   Abs Immature Granulocytes  0.01 0.00 - 0.07 K/uL  ABO/Rh     Status: None   Collection Time: 04/14/22  9:35 AM  Result Value Ref Range   ABO/RH(D)      A POS Performed at [facility], [place] [address], [place],  [address]   Comprehensive metabolic panel     Status: Abnormal   Collection Time: 04/14/22  2:25 PM  Result Value Ref Range   Sodium 139 135 - 145 mmol/L   Potassium 4.1 3.5 - 5.1 mmol/L   Chloride 112 (H) 98 - 111 mmol/L   CO2 19 (L) 22 - 32 mmol/L   Glucose, Bld 243 (H) 70 - 99 mg/dL   BUN 10 6 - 20 mg/dL   Creatinine, Ser 0.87 0.44 - 1.00 mg/dL   Calcium 8.9 8.9 - 10.3 mg/dL   Total Protein 6.6 6.5 - 8.1 g/dL   Albumin 3.9 3.5 - 5.0 g/dL   AST 30 15 - 41 U/L   ALT 22 0 - 44 U/L   Alkaline Phosphatase 47 38 - 126 U/L   Total Bilirubin 0.5 0.3 - 1.2 mg/dL   GFR, Estimated >60 >60 mL/min   Anion gap 8 5 - 15  Treatments: surgery: see hospital course  Discharge Exam: Blood pressure (!) 145/87, pulse 99, temperature 98.6 F (37 C), resp. rate 14, height '5\' 6"'$  (1.676 m), weight 84.3 kg, last menstrual period 03/29/2022, SpO2 98 %.   Disposition: Discharge disposition: 01-Home or Self Care       Discharge Instructions     Call MD for:  temperature >100.4   Complete by: As directed    Diet - low sodium heart healthy   Complete by: As directed    Discharge instructions   Complete by: As directed    No driving on narcotics, no sexual activity for 2 weeks.   Increase activity slowly   Complete by: As directed    May shower / Bathe   Complete by: As directed    Shower, no bath for 2 weeks.   Remove dressing in 24 hours   Complete by: As directed    Sexual Activity Restrictions   Complete by: As directed    No sexual activity for 2 weeks.      Allergies as of 04/14/2022   No Known Allergies      Medication List     STOP taking these medications    levonorgestrel 20 MCG/DAY Iud Commonly known as: MIRENA       TAKE these medications     acetaminophen 500 MG tablet Commonly known as: TYLENOL Take 500 mg by mouth every 8 (eight) hours as needed (pain.).   Hailey 1.5/30 1.5-30 MG-MCG tablet Generic drug: Norethindrone Acetate-Ethinyl Estradiol Take 1 tablet by mouth daily.   ibuprofen 800 MG tablet Commonly known as: ADVIL Take 1 tablet (800 mg total) by mouth 3 (three) times daily. What changed:  medication strength how much to take when to take this reasons to take this   loratadine 10 MG tablet Commonly known as: CLARITIN Take 10 mg by mouth daily.   melatonin 5 MG Tabs Take 5 mg by mouth at bedtime as needed (sleep).   traMADol 50 MG tablet Commonly known as: Ultram Take 1 tablet (50 mg total) by mouth every 6 (six) hours as needed.   VITAMIN C PO Take by mouth daily. Per pt takes 3 gummies daily        Follow-up Information     Bobbye Charleston, MD Follow up in 2 week(s).   Specialty: Obstetrics and Gynecology Contact information: [address] [place] [address] [phone]                 Signed: Daria Pastures 04/14/2022, 4:43 PM

## 2022-04-14 NOTE — Progress Notes (Signed)
Fluid deficit was near over 2 liters.  Keeping pt for obs and checking CMP at 3 pm today.

## 2022-04-14 NOTE — Transfer of Care (Signed)
Immediate Anesthesia Transfer of Care Note  Patient: Amber Davies  Procedure(s) Performed: INTRAUTERINE DEVICE (IUD) REMOVAL DILATATION & CURETTAGE/HYSTEROSCOPY WITH [place] AND LEIOMYOMA REMOVAL  Patient Location: PACU  Anesthesia Type:General  Level of Consciousness: awake, alert  and oriented  Airway & Oxygen Therapy: Patient Spontanous Breathing  Post-op Assessment: Report given to RN and Post -op Vital signs reviewed and stable  Post vital signs: Reviewed and stable  Last Vitals:  Vitals Value Taken Time  BP 126/83 04/14/22 1122  Temp    Pulse 83 04/14/22 1123  Resp 15 04/14/22 1123  SpO2 96 % 04/14/22 1123  Vitals shown include unvalidated device data.  Last Pain:  Vitals:   04/14/22 0908  TempSrc: Oral  PainSc: 0-No pain      Patients Stated Pain Goal: 6 (06/04/14 9458)  Complications: No notable events documented.

## 2022-04-14 NOTE — Progress Notes (Signed)
Pt voiding and not bleeding more than a period.  Vitals:   04/14/22 1145 04/14/22 1200 04/14/22 1215 04/14/22 1345  BP: (!) 163/97 (!) 149/85 (!) 150/88 (!) 145/87  Pulse: 70 66 70 99  Resp: '13 11 14 14  '$ Temp:   98.4 F (36.9 C) 98.6 F (37 C)  TempSrc:      SpO2: 100% 100% 100% 98%  Weight:      Height:         Results for orders placed or performed during the hospital encounter of 04/14/22 (from the past 24 hour(s))  Pregnancy, urine POC     Status: None   Collection Time: 04/14/22  8:51 AM  Result Value Ref Range   Preg Test, Ur NEGATIVE NEGATIVE  Type and screen     Status: None   Collection Time: 04/14/22  9:28 AM  Result Value Ref Range   ABO/RH(D) A POS    Antibody Screen NEG    Sample Expiration      04/17/2022,2359 Performed at [facility], [place] [address] [place], [place] [address]   CBC with Differential/Platelet     Status: Abnormal   Collection Time: 04/14/22  9:28 AM  Result Value Ref Range   WBC 3.9 (L) 4.0 - 10.5 K/uL   RBC 4.72 3.87 - 5.11 MIL/uL   Hemoglobin 15.4 (H) 12.0 - 15.0 g/dL   HCT 44.4 36.0 - 46.0 %   MCV 94.1 80.0 - 100.0 fL   MCH 32.6 26.0 - 34.0 pg   MCHC 34.7 30.0 - 36.0 g/dL   RDW 12.1 11.5 - 15.5 %   Platelets 276 150 - 400 K/uL   nRBC 0.0 0.0 - 0.2 %   Neutrophils Relative % 61 %   Neutro Abs 2.4 1.7 - 7.7 K/uL   Lymphocytes Relative 30 %   Lymphs Abs 1.2 0.7 - 4.0 K/uL   Monocytes Relative 6 %   Monocytes Absolute 0.2 0.1 - 1.0 K/uL   Eosinophils Relative 2 %   Eosinophils Absolute 0.1 0.0 - 0.5 K/uL   Basophils Relative 1 %   Basophils Absolute 0.0 0.0 - 0.1 K/uL   Immature Granulocytes 0 %   Abs Immature Granulocytes 0.01 0.00 - 0.07 K/uL  ABO/Rh     Status: None   Collection Time: 04/14/22  9:35 AM  Result Value Ref Range   ABO/RH(D)      A POS Performed at [facility], [place] [address] [place], [place] [address]   Comprehensive metabolic panel     Status: Abnormal   Collection  Time: 04/14/22  2:25 PM  Result Value Ref Range   Sodium 139 135 - 145 mmol/L   Potassium 4.1 3.5 - 5.1 mmol/L   Chloride 112 (H) 98 - 111 mmol/L   CO2 19 (L) 22 - 32 mmol/L   Glucose, Bld 243 (H) 70 - 99 mg/dL   BUN 10 6 - 20 mg/dL   Creatinine, Ser 0.87 0.44 - 1.00 mg/dL   Calcium 8.9 8.9 - 10.3 mg/dL   Total Protein 6.6 6.5 - 8.1 g/dL   Albumin 3.9 3.5 - 5.0 g/dL   AST 30 15 - 41 U/L   ALT 22 0 - 44 U/L   Alkaline Phosphatase 47 38 - 126 U/L   Total Bilirubin 0.5 0.3 - 1.2 mg/dL   GFR, Estimated >60 >60 mL/min   Anion gap 8 5 - 15    CMP normal.  BPS somewhat elevated but  trending down.  Ok to d/c to home.  Tramadol for excessive cramping.

## 2022-04-14 NOTE — Anesthesia Postprocedure Evaluation (Signed)
Anesthesia Post Note  Patient: Amber Davies  Procedure(s) Performed: INTRAUTERINE DEVICE (IUD) [place] AND LEIOMYOMA REMOVAL     Patient location during evaluation: PACU Anesthesia Type: General Level of consciousness: awake and alert Pain management: pain level controlled Vital Signs Assessment: post-procedure vital signs reviewed and stable Respiratory status: spontaneous breathing, nonlabored ventilation, respiratory function stable and patient connected to nasal cannula oxygen Cardiovascular status: blood pressure returned to baseline and stable Postop Assessment: no apparent nausea or vomiting Anesthetic complications: no   No notable events documented.  Last Vitals:  Vitals:   04/14/22 1145 04/14/22 1200  BP: (!) 163/97 (!) 149/85  Pulse: 70 66  Resp: 13 11  Temp:    SpO2: 100% 100%    Last Pain:  Vitals:   04/14/22 1200  TempSrc:   PainSc: [place]

## 2022-04-14 NOTE — Progress Notes (Signed)
There has been no change in the patients history, status or exam since the history and physical.  Vitals:   03/31/22 1553 04/14/22 0908  BP:  (!) 150/93  Pulse:  91  Resp:  17  Temp:  98.5 F (36.9 C)  TempSrc:  Oral  SpO2:  99%  Weight: 82.6 kg 84.3 kg  Height: '5\' 6"'$  (1.676 m) '5\' 6"'$  (1.676 m)    Results for orders placed or performed during the hospital encounter of 04/14/22 (from the past 72 hour(s))  Pregnancy, urine POC     Status: None   Collection Time: 04/14/22  8:51 AM  Result Value Ref Range   Preg Test, Ur NEGATIVE NEGATIVE    Comment:        THE SENSITIVITY OF THIS METHODOLOGY IS >24 mIU/mL     Daria Pastures

## 2022-04-14 NOTE — Anesthesia Procedure Notes (Signed)
Procedure Name: LMA Insertion Date/Time: 04/14/2022 10:09 AM  Performed by: Clearnce Sorrel, CRNAPre-anesthesia Checklist: Patient identified, Emergency Drugs available, Suction available and Patient being monitored Patient Re-evaluated:Patient Re-evaluated prior to induction Oxygen Delivery Method: Circle System Utilized Preoxygenation: Pre-oxygenation with 100% oxygen Induction Type: IV induction Ventilation: Mask ventilation without difficulty LMA: LMA inserted LMA Size: 4.0 Number of attempts: 1 Airway Equipment and Method: Bite block Placement Confirmation: positive ETCO2 Tube secured with: Tape Dental Injury: Teeth and Oropharynx as per pre-operative assessment

## 2022-04-14 NOTE — Brief Op Note (Signed)
04/14/2022  11:10 AM  PATIENT:  Amber Davies  51 y.o. female  PRE-OPERATIVE DIAGNOSIS:  intracavitary leiomyoma of uterus  POST-OPERATIVE DIAGNOSIS:  intracavitary leiomyoma of uterus  PROCEDURE:  Procedure(s): INTRAUTERINE DEVICE (IUD) REMOVAL (N/A) DILATATION & CURETTAGE/HYSTEROSCOPY WITH MYOSURE REACH AND LEIOMYOMA REMOVAL (N/A)  SURGEON:  Surgeon(s) and Role:    * Bobbye Charleston, MD - Primary  ANESTHESIA:   general  EBL: 200  Hysteroscopic deficit: 2075  LOCAL MEDICATIONS USED:  NONE  SPECIMEN:  Source of Specimen:  intracavitary fibroid  DISPOSITION OF SPECIMEN:  PATHOLOGY  COUNTS:  YES  TOURNIQUET:  * No tourniquets in log *  DICTATION: .Note written in EPIC  PLAN OF CARE: Admit for overnight observation  PATIENT DISPOSITION:  PACU - hemodynamically stable.   Delay start of Pharmacological VTE agent (>24hrs) due to surgical blood loss or risk of bleeding: not applicable

## 2022-04-14 NOTE — Op Note (Signed)
04/14/2022  11:10 AM  PATIENT:  Amber Davies  51 y.o. female  PRE-OPERATIVE DIAGNOSIS:  intracavitary leiomyoma of uterus  POST-OPERATIVE DIAGNOSIS:  intracavitary leiomyoma of uterus  PROCEDURE:  Procedure(s): INTRAUTERINE DEVICE (IUD) REMOVAL (N/A) DILATATION & CURETTAGE/HYSTEROSCOPY WITH MYOSURE REACH AND LEIOMYOMA REMOVAL (N/A)  SURGEON:  Surgeon(s) and Role:    * Bobbye Charleston, MD - Primary  ANESTHESIA:   general  EBL: 200  Hysteroscopic deficit: 2075  LOCAL MEDICATIONS USED:  NONE  SPECIMEN:  Source of Specimen:  intracavitary fibroid  DISPOSITION OF SPECIMEN:  PATHOLOGY  COUNTS:  YES  TOURNIQUET:  * No tourniquets in log *  DICTATION: .Note written in EPIC  PLAN OF CARE: Admit for overnight observation  PATIENT DISPOSITION:  PACU - hemodynamically stable.   Delay start of Pharmacological VTE agent (>24hrs) due to surgical blood loss or risk of bleeding: not applicable  Findings: IUD behind large intrauterine fibroid with base near lower uterine segment.  Meds: methergine  After adequate anesthesia was achieved, the patient was prepped and draped in the usual sterile fashion.  The speculum was placed in the vagina and the cervix stabilized with a single-tooth tenaculum.  The cervix was dilated with pratt dilators and the hysteroscope passed inside the endometrial cavity.  The above findings were noted and the myosure was used to remove most of the base of the fibroid.  A one point the IUD was back into view and the polyp forceps were used to remove it intact.  The base of the fibroid was continued to removed.  Finally the polyp forceps were used to remove the bulk of the fibroid.   The cavity was clear except for a a shaggy base of fibroid. The tenaculum site was bleeding and two stitches of 2-0 vicryl R ensured hemostasis.  All instruments were removed from the vagina.  The patient tolerated the procedure well.    [name]

## 2022-04-14 NOTE — Anesthesia Preprocedure Evaluation (Signed)
Anesthesia Evaluation  Patient identified by MRN, date of birth, ID band Patient awake    Reviewed: Allergy & Precautions, H&P , NPO status , Patient's Chart, lab work & pertinent test results  Airway Mallampati: II   Neck ROM: full    Dental   Pulmonary former smoker,    breath sounds clear to auscultation       Cardiovascular negative cardio ROS   Rhythm:regular Rate:Normal     Neuro/Psych    GI/Hepatic   Endo/Other    Renal/GU      Musculoskeletal   Abdominal   Peds  Hematology   Anesthesia Other Findings   Reproductive/Obstetrics                             Anesthesia Physical Anesthesia Plan  ASA: 2  Anesthesia Plan: General   Post-op Pain Management:    Induction: Intravenous  PONV Risk Score and Plan: 3 and Ondansetron, Dexamethasone, Midazolam and Treatment may vary due to age or medical condition  Airway Management Planned: LMA  Additional Equipment:   Intra-op Plan:   Post-operative Plan: Extubation in OR  Informed Consent: I have reviewed the patients History and Physical, chart, labs and discussed the procedure including the risks, benefits and alternatives for the proposed anesthesia with the patient or authorized representative who has indicated his/her understanding and acceptance.     Dental advisory given  Plan Discussed with: CRNA, Anesthesiologist and Surgeon  Anesthesia Plan Comments:         Anesthesia Quick Evaluation

## 2022-04-15 ENCOUNTER — Encounter (HOSPITAL_BASED_OUTPATIENT_CLINIC_OR_DEPARTMENT_OTHER): Payer: Self-pay | Admitting: Obstetrics and Gynecology

## 2022-04-15 LAB — SURGICAL PATHOLOGY

## 2022-05-04 DIAGNOSIS — Z131 Encounter for screening for diabetes mellitus: Secondary | ICD-10-CM | POA: Diagnosis not present

## 2022-05-04 DIAGNOSIS — Z0000 Encounter for general adult medical examination without abnormal findings: Secondary | ICD-10-CM | POA: Diagnosis not present

## 2022-05-04 DIAGNOSIS — Z13220 Encounter for screening for lipoid disorders: Secondary | ICD-10-CM | POA: Diagnosis not present

## 2022-05-10 DIAGNOSIS — Z0000 Encounter for general adult medical examination without abnormal findings: Secondary | ICD-10-CM | POA: Diagnosis not present

## 2022-08-11 DIAGNOSIS — G479 Sleep disorder, unspecified: Secondary | ICD-10-CM | POA: Diagnosis not present

## 2022-08-11 DIAGNOSIS — E7800 Pure hypercholesterolemia, unspecified: Secondary | ICD-10-CM | POA: Diagnosis not present

## 2022-10-08 DIAGNOSIS — G5603 Carpal tunnel syndrome, bilateral upper limbs: Secondary | ICD-10-CM | POA: Diagnosis not present

## 2022-10-22 DIAGNOSIS — G5603 Carpal tunnel syndrome, bilateral upper limbs: Secondary | ICD-10-CM | POA: Diagnosis not present

## 2022-10-25 DIAGNOSIS — G4719 Other hypersomnia: Secondary | ICD-10-CM | POA: Diagnosis not present

## 2022-10-26 DIAGNOSIS — G4719 Other hypersomnia: Secondary | ICD-10-CM | POA: Diagnosis not present

## 2023-01-27 DIAGNOSIS — Z01419 Encounter for gynecological examination (general) (routine) without abnormal findings: Secondary | ICD-10-CM | POA: Diagnosis not present

## 2023-01-27 DIAGNOSIS — Z1231 Encounter for screening mammogram for malignant neoplasm of breast: Secondary | ICD-10-CM | POA: Diagnosis not present

## 2023-01-27 DIAGNOSIS — Z6830 Body mass index (BMI) 30.0-30.9, adult: Secondary | ICD-10-CM | POA: Diagnosis not present

## 2023-05-12 DIAGNOSIS — Z131 Encounter for screening for diabetes mellitus: Secondary | ICD-10-CM | POA: Diagnosis not present

## 2023-05-12 DIAGNOSIS — Z0000 Encounter for general adult medical examination without abnormal findings: Secondary | ICD-10-CM | POA: Diagnosis not present

## 2023-05-12 DIAGNOSIS — Z13220 Encounter for screening for lipoid disorders: Secondary | ICD-10-CM | POA: Diagnosis not present

## 2023-05-13 DIAGNOSIS — Z23 Encounter for immunization: Secondary | ICD-10-CM | POA: Diagnosis not present

## 2023-05-13 DIAGNOSIS — Z0000 Encounter for general adult medical examination without abnormal findings: Secondary | ICD-10-CM | POA: Diagnosis not present

## 2023-06-03 DIAGNOSIS — Z23 Encounter for immunization: Secondary | ICD-10-CM | POA: Diagnosis not present

## 2023-10-07 DIAGNOSIS — Z23 Encounter for immunization: Secondary | ICD-10-CM | POA: Diagnosis not present

## 2024-02-01 DIAGNOSIS — Z1231 Encounter for screening mammogram for malignant neoplasm of breast: Secondary | ICD-10-CM | POA: Diagnosis not present

## 2024-02-01 DIAGNOSIS — Z01419 Encounter for gynecological examination (general) (routine) without abnormal findings: Secondary | ICD-10-CM | POA: Diagnosis not present

## 2024-05-22 DIAGNOSIS — D582 Other hemoglobinopathies: Secondary | ICD-10-CM | POA: Diagnosis not present

## 2024-05-22 DIAGNOSIS — E669 Obesity, unspecified: Secondary | ICD-10-CM | POA: Diagnosis not present

## 2024-05-22 DIAGNOSIS — E7800 Pure hypercholesterolemia, unspecified: Secondary | ICD-10-CM | POA: Diagnosis not present

## 2024-05-22 DIAGNOSIS — I10 Essential (primary) hypertension: Secondary | ICD-10-CM | POA: Diagnosis not present

## 2024-06-12 DIAGNOSIS — D582 Other hemoglobinopathies: Secondary | ICD-10-CM | POA: Diagnosis not present
